# Patient Record
Sex: Female | Born: 1950 | Race: Black or African American | Hispanic: No | Smoking: Former smoker
Health system: Southern US, Community
[De-identification: ages and names within clinical notes are randomized; demographics above are authoritative.]

## PROBLEM LIST (undated history)

## (undated) DIAGNOSIS — Z973 Presence of spectacles and contact lenses: Secondary | ICD-10-CM

## (undated) DIAGNOSIS — E785 Hyperlipidemia, unspecified: Secondary | ICD-10-CM

## (undated) DIAGNOSIS — F329 Major depressive disorder, single episode, unspecified: Secondary | ICD-10-CM

## (undated) DIAGNOSIS — Z01419 Encounter for gynecological examination (general) (routine) without abnormal findings: Secondary | ICD-10-CM

## (undated) DIAGNOSIS — F32A Depression, unspecified: Secondary | ICD-10-CM

## (undated) DIAGNOSIS — Z87891 Personal history of nicotine dependence: Secondary | ICD-10-CM

## (undated) DIAGNOSIS — Z972 Presence of dental prosthetic device (complete) (partial): Secondary | ICD-10-CM

## (undated) DIAGNOSIS — M8580 Other specified disorders of bone density and structure, unspecified site: Secondary | ICD-10-CM

## (undated) DIAGNOSIS — I10 Essential (primary) hypertension: Secondary | ICD-10-CM

## (undated) HISTORY — DX: Other specified disorders of bone density and structure, unspecified site: M85.80

## (undated) HISTORY — DX: Presence of spectacles and contact lenses: Z97.3

## (undated) HISTORY — DX: Personal history of nicotine dependence: Z87.891

## (undated) HISTORY — DX: Major depressive disorder, single episode, unspecified: F32.9

## (undated) HISTORY — DX: Hyperlipidemia, unspecified: E78.5

## (undated) HISTORY — DX: Encounter for gynecological examination (general) (routine) without abnormal findings: Z01.419

## (undated) HISTORY — DX: Depression, unspecified: F32.A

## (undated) HISTORY — DX: Essential (primary) hypertension: I10

## (undated) HISTORY — DX: Presence of dental prosthetic device (complete) (partial): Z97.2

---

## 1997-07-01 ENCOUNTER — Other Ambulatory Visit: Admission: RE | Admit: 1997-07-01 | Discharge: 1997-07-01 | Payer: Self-pay | Admitting: Obstetrics

## 1997-07-02 ENCOUNTER — Other Ambulatory Visit: Admission: RE | Admit: 1997-07-02 | Discharge: 1997-07-02 | Payer: Self-pay | Admitting: Obstetrics

## 1997-07-03 ENCOUNTER — Ambulatory Visit: Admission: RE | Admit: 1997-07-03 | Discharge: 1997-07-03 | Payer: Self-pay | Admitting: Obstetrics

## 1998-07-07 ENCOUNTER — Other Ambulatory Visit: Admission: RE | Admit: 1998-07-07 | Discharge: 1998-07-07 | Payer: Self-pay | Admitting: Obstetrics

## 1998-07-09 ENCOUNTER — Ambulatory Visit (HOSPITAL_COMMUNITY): Admission: RE | Admit: 1998-07-09 | Discharge: 1998-07-09 | Payer: Self-pay | Admitting: *Deleted

## 1998-07-15 ENCOUNTER — Encounter: Payer: Self-pay | Admitting: Obstetrics

## 1998-07-15 ENCOUNTER — Ambulatory Visit (HOSPITAL_COMMUNITY): Admission: RE | Admit: 1998-07-15 | Discharge: 1998-07-15 | Payer: Self-pay | Admitting: Obstetrics

## 1998-11-17 ENCOUNTER — Other Ambulatory Visit: Admission: RE | Admit: 1998-11-17 | Discharge: 1998-11-17 | Payer: Self-pay | Admitting: Obstetrics

## 1999-01-25 ENCOUNTER — Ambulatory Visit (HOSPITAL_COMMUNITY): Admission: RE | Admit: 1999-01-25 | Discharge: 1999-01-25 | Payer: Self-pay | Admitting: Obstetrics

## 1999-01-25 ENCOUNTER — Encounter: Payer: Self-pay | Admitting: Obstetrics

## 1999-12-21 ENCOUNTER — Other Ambulatory Visit: Admission: RE | Admit: 1999-12-21 | Discharge: 1999-12-21 | Payer: Self-pay | Admitting: Obstetrics

## 2000-03-01 ENCOUNTER — Encounter: Payer: Self-pay | Admitting: Obstetrics

## 2000-03-01 ENCOUNTER — Ambulatory Visit (HOSPITAL_COMMUNITY): Admission: RE | Admit: 2000-03-01 | Discharge: 2000-03-01 | Payer: Self-pay | Admitting: Obstetrics

## 2001-04-16 ENCOUNTER — Encounter: Payer: Self-pay | Admitting: Obstetrics

## 2001-04-16 ENCOUNTER — Ambulatory Visit (HOSPITAL_COMMUNITY): Admission: RE | Admit: 2001-04-16 | Discharge: 2001-04-16 | Payer: Self-pay | Admitting: Obstetrics

## 2002-05-28 ENCOUNTER — Ambulatory Visit (HOSPITAL_COMMUNITY): Admission: RE | Admit: 2002-05-28 | Discharge: 2002-05-28 | Payer: Self-pay | Admitting: Obstetrics

## 2002-05-28 ENCOUNTER — Encounter: Payer: Self-pay | Admitting: Obstetrics

## 2003-02-16 LAB — HM COLONOSCOPY: HM Colonoscopy: NORMAL

## 2003-06-02 ENCOUNTER — Ambulatory Visit (HOSPITAL_COMMUNITY): Admission: RE | Admit: 2003-06-02 | Discharge: 2003-06-02 | Payer: Self-pay | Admitting: Obstetrics

## 2004-02-14 HISTORY — PX: COLONOSCOPY: SHX174

## 2004-06-07 ENCOUNTER — Ambulatory Visit (HOSPITAL_COMMUNITY): Admission: RE | Admit: 2004-06-07 | Discharge: 2004-06-07 | Payer: Self-pay | Admitting: Obstetrics

## 2005-06-14 ENCOUNTER — Ambulatory Visit (HOSPITAL_COMMUNITY): Admission: RE | Admit: 2005-06-14 | Discharge: 2005-06-14 | Payer: Self-pay | Admitting: Obstetrics

## 2006-07-11 ENCOUNTER — Ambulatory Visit (HOSPITAL_COMMUNITY): Admission: RE | Admit: 2006-07-11 | Discharge: 2006-07-11 | Payer: Self-pay | Admitting: Obstetrics

## 2007-07-16 ENCOUNTER — Ambulatory Visit (HOSPITAL_COMMUNITY): Admission: RE | Admit: 2007-07-16 | Discharge: 2007-07-16 | Payer: Self-pay | Admitting: Obstetrics

## 2007-07-18 ENCOUNTER — Ambulatory Visit: Payer: Self-pay | Admitting: Surgery

## 2008-01-16 ENCOUNTER — Ambulatory Visit (HOSPITAL_COMMUNITY): Admission: RE | Admit: 2008-01-16 | Discharge: 2008-01-16 | Payer: Self-pay | Admitting: Family Medicine

## 2008-07-29 ENCOUNTER — Ambulatory Visit (HOSPITAL_COMMUNITY): Admission: RE | Admit: 2008-07-29 | Discharge: 2008-07-29 | Payer: Self-pay | Admitting: Obstetrics

## 2009-03-23 ENCOUNTER — Ambulatory Visit: Payer: Self-pay | Admitting: Family Medicine

## 2009-06-22 ENCOUNTER — Ambulatory Visit: Payer: Self-pay | Admitting: Family Medicine

## 2009-07-13 ENCOUNTER — Ambulatory Visit: Payer: Self-pay | Admitting: Family Medicine

## 2009-08-03 ENCOUNTER — Ambulatory Visit (HOSPITAL_COMMUNITY): Admission: RE | Admit: 2009-08-03 | Discharge: 2009-08-03 | Payer: Self-pay | Admitting: Obstetrics

## 2009-10-13 ENCOUNTER — Ambulatory Visit: Payer: Self-pay | Admitting: Family Medicine

## 2010-02-16 ENCOUNTER — Ambulatory Visit
Admission: RE | Admit: 2010-02-16 | Discharge: 2010-02-16 | Payer: Self-pay | Source: Home / Self Care | Attending: Family Medicine | Admitting: Family Medicine

## 2010-05-18 ENCOUNTER — Ambulatory Visit (INDEPENDENT_AMBULATORY_CARE_PROVIDER_SITE_OTHER): Payer: 59 | Admitting: Family Medicine

## 2010-05-18 DIAGNOSIS — F17200 Nicotine dependence, unspecified, uncomplicated: Secondary | ICD-10-CM

## 2010-05-18 DIAGNOSIS — I10 Essential (primary) hypertension: Secondary | ICD-10-CM

## 2010-05-18 DIAGNOSIS — E119 Type 2 diabetes mellitus without complications: Secondary | ICD-10-CM

## 2010-05-18 DIAGNOSIS — F329 Major depressive disorder, single episode, unspecified: Secondary | ICD-10-CM

## 2010-05-18 DIAGNOSIS — E7800 Pure hypercholesterolemia, unspecified: Secondary | ICD-10-CM

## 2010-07-20 ENCOUNTER — Other Ambulatory Visit (HOSPITAL_COMMUNITY): Payer: Self-pay | Admitting: Obstetrics

## 2010-07-20 DIAGNOSIS — Z1231 Encounter for screening mammogram for malignant neoplasm of breast: Secondary | ICD-10-CM

## 2010-08-01 ENCOUNTER — Encounter: Payer: Self-pay | Admitting: Family Medicine

## 2010-08-01 DIAGNOSIS — F329 Major depressive disorder, single episode, unspecified: Secondary | ICD-10-CM | POA: Insufficient documentation

## 2010-08-01 DIAGNOSIS — E789 Disorder of lipoprotein metabolism, unspecified: Secondary | ICD-10-CM | POA: Insufficient documentation

## 2010-08-01 DIAGNOSIS — I10 Essential (primary) hypertension: Secondary | ICD-10-CM | POA: Insufficient documentation

## 2010-08-01 DIAGNOSIS — E7800 Pure hypercholesterolemia, unspecified: Secondary | ICD-10-CM | POA: Insufficient documentation

## 2010-08-01 DIAGNOSIS — E119 Type 2 diabetes mellitus without complications: Secondary | ICD-10-CM | POA: Insufficient documentation

## 2010-08-01 DIAGNOSIS — M8580 Other specified disorders of bone density and structure, unspecified site: Secondary | ICD-10-CM | POA: Insufficient documentation

## 2010-08-01 DIAGNOSIS — F17200 Nicotine dependence, unspecified, uncomplicated: Secondary | ICD-10-CM | POA: Insufficient documentation

## 2010-08-01 DIAGNOSIS — E559 Vitamin D deficiency, unspecified: Secondary | ICD-10-CM | POA: Insufficient documentation

## 2010-08-08 ENCOUNTER — Ambulatory Visit (HOSPITAL_COMMUNITY)
Admission: RE | Admit: 2010-08-08 | Discharge: 2010-08-08 | Disposition: A | Discharge status: Home / Self Care | Payer: 59 | Source: Ambulatory Visit | Attending: Obstetrics | Admitting: Obstetrics

## 2010-08-08 DIAGNOSIS — Z1231 Encounter for screening mammogram for malignant neoplasm of breast: Secondary | ICD-10-CM | POA: Insufficient documentation

## 2010-08-19 ENCOUNTER — Encounter: Payer: Self-pay | Admitting: Family Medicine

## 2010-08-22 ENCOUNTER — Ambulatory Visit: Payer: 59 | Admitting: Family Medicine

## 2010-08-22 ENCOUNTER — Encounter: Payer: Self-pay | Admitting: Medical

## 2010-08-22 ENCOUNTER — Ambulatory Visit (INDEPENDENT_AMBULATORY_CARE_PROVIDER_SITE_OTHER): Payer: BC Managed Care – PPO | Admitting: Medical

## 2010-08-22 VITALS — BP 130/90 | HR 80 | Temp 98.2°F | Ht 66.5 in | Wt 182.0 lb

## 2010-08-22 DIAGNOSIS — E559 Vitamin D deficiency, unspecified: Secondary | ICD-10-CM

## 2010-08-22 DIAGNOSIS — E119 Type 2 diabetes mellitus without complications: Secondary | ICD-10-CM

## 2010-08-22 DIAGNOSIS — F32A Depression, unspecified: Secondary | ICD-10-CM

## 2010-08-22 DIAGNOSIS — E785 Hyperlipidemia, unspecified: Secondary | ICD-10-CM

## 2010-08-22 DIAGNOSIS — I10 Essential (primary) hypertension: Secondary | ICD-10-CM

## 2010-08-22 DIAGNOSIS — F3289 Other specified depressive episodes: Secondary | ICD-10-CM

## 2010-08-22 DIAGNOSIS — F329 Major depressive disorder, single episode, unspecified: Secondary | ICD-10-CM

## 2010-08-22 MED ORDER — LOSARTAN POTASSIUM-HCTZ 100-12.5 MG PO TABS: 1.0000 | ORAL_TABLET | Freq: Every day | ORAL | Status: DC

## 2010-08-22 MED ORDER — BUPROPION HCL ER (XL) 150 MG PO TB24: 150.0000 mg | ORAL_TABLET | Freq: Every day | ORAL | Status: DC

## 2010-08-22 MED ORDER — DILTIAZEM HCL ER COATED BEADS 180 MG PO CP24: 180.0000 mg | ORAL_CAPSULE | Freq: Every day | ORAL | Status: DC

## 2010-08-22 NOTE — Progress Notes (Signed)
Addended by: Dorthula Perfect on: 08/22/2010 09:21 AM   Modules accepted: Orders

## 2010-08-22 NOTE — Progress Notes (Signed)
Subjective:    Brenda [name] is an 60 y.o. female who presents for follow up of chronic disease management.   Last visit 05/18/10 with Dr. Lynelle Doctor.  At that time Wellbutrin was added for depressed mood, fish oil added, and Pravastatin was increased to 80mg .  However she is taking the 40mg  instead.    Current symptoms include: none. Patient denies foot ulcerations, hyperglycemia, hypoglycemia , nausea, polydipsia and polyuria.  Last dilated eye exam within last 11mo.     Patient here for follow-up of elevated blood pressure.  She is exercising and is adherent to a low-salt diet.  Blood pressure is well controlled at home. Cardiac symptoms: none. Patient denies: chest pain, dyspnea, fatigue and irregular heart beat. Cardiovascular risk factors: diabetes mellitus, dyslipidemia and hypertension. Use of agents associated with hypertension: none. History of target organ damage: none.   Brenda [name] is here for follow up of elevated cholesterol. Compliance with treatment has been good. The patient exercises daily. Patient denies muscle pain associated with her medications.  The following portions of the patient's history were reviewed and updated as appropriate: allergies, current medications, past family history, past medical history, past social history, past surgical history and problem list.  Past Medical History  Diagnosis Date  . Hypertension   . Depression   . Vitamin D deficiency   . Osteoporosis     OSTEOPENIA  . Diabetes mellitus   . Smoker   . Hyperlipidemia     Review of Systems Constitutional: denies recent fevers, chills, weight changes, anorexia Allergy: denies recent allergy problems, nasal congestion, sneezing Skin: +hair seems brittle;  denies foot lesions, rash ENT: denies cough, runny nose, sore throat, hoarseness Cardiology: denies chest pain, palpitations, edema, orthopnea, paroxysmal nocturnal dyspnea Respiratory: denies shortness of breath, dyspnea on exertion,  wheezing Gastroenterology: no abdominal pain, nausea, vomiting, diarrhea, constipation, bowel change, blood in stool Musculoskeletal: denies arthralgias, myalgias, cramps Opthalmology: denies vision change, blurred vision, allergy symptoms Urology: denies dysuria, frequency, urgency, blood in urine, stream changes Neurology: denies numbness, tingling, weakness, gait changes     Objective:   Filed Vitals:   08/22/10 0835  BP: 130/90  Pulse: 80  Temp: 98.2 F (36.8 C)    General Appearance:    Alert, no distress, black female, WD/WN   HEENT:    Normocephalic, without obvious abnormality, PERRLA, conjunctiva/corneas clear, EOM intact, fundi benign, TMs and external ears normal, nares patent, mucosa normal, pharynx normal appearing  Oral cavity:   Lips, mucosa, and tongue normal; teeth and gums normal  Neck:   Supple, no lymphadenopathy;  thyroid:  no    enlargement/tenderness/nodules; no carotid   bruit or JVD  Lungs:     Clear to auscultation bilaterally without wheezes, rales or       rhonchi; respirations unlabored   Heart:    Regular rate and rhythm, S1 and S2 normal, no murmur  Extremities:   No clubbing, cyanosis or edema  Pulses:   2+ and symmetric all extremities  Skin:   Warm, dry, normal turgor, no foot lesions          Psych:   Normal mood, affect, hygiene and grooming.     Assessment:   Encounter Diagnoses  Name Primary?  . Type II or unspecified type diabetes mellitus without mention of complication, not stated as uncontrolled Yes  . Hyperlipidemia   . Vitamin D deficiency   . Essential hypertension, benign   . Depression       Plan:  DM type II - HgbA1C jumped up to 6.7%, up from 6.4% in April.  She declines Metformin for now, and she notes much more bread intake in the last several weeks.  She will work harder on diet, increase walking, and recheck in 3-4 mo.  Will consider Metformin if HgbA1C still over 6.4% at that time.  She is UTD on eye exam, checks feet  daily.    Hyperlipidemia- labs today.  She has been taking Pravastatin 40mg  instead of 80mg . This was suppose to be 80mg  since last check in April.  If lipids not at goal, consider Pravastatin 80mg  or change to other medication.  Vit D deficiency- c/t Vit D  HTN - per home readings, under pretty good control.  C/t same meds, low salt diet, regular exercise.  Depression - improved on current regimen.  C/t same meds.

## 2010-08-23 ENCOUNTER — Telehealth: Payer: Self-pay | Admitting: *Deleted

## 2010-08-23 DIAGNOSIS — E7800 Pure hypercholesterolemia, unspecified: Secondary | ICD-10-CM

## 2010-08-23 LAB — COMPREHENSIVE METABOLIC PANEL
AST: 18 U/L (ref 0–37)
Alkaline Phosphatase: 82 U/L (ref 39–117)
BUN: 16 mg/dL (ref 6–23)
CO2: 22 mEq/L (ref 19–32)
Calcium: 9.9 mg/dL (ref 8.4–10.5)
Creat: 0.85 mg/dL (ref 0.50–1.10)

## 2010-08-23 LAB — LIPID PANEL
Cholesterol: 204 mg/dL — ABNORMAL HIGH (ref 0–200)
HDL: 40 mg/dL (ref >39)
Total CHOL/HDL Ratio: 5.1 Ratio
Triglycerides: 119 mg/dL (ref <150)
VLDL: 24 mg/dL (ref 0–40)

## 2010-08-23 MED ORDER — ATORVASTATIN CALCIUM 40 MG PO TABS: 40.0000 mg | ORAL_TABLET | Freq: Every day | ORAL | Status: DC

## 2010-08-23 NOTE — Telephone Encounter (Addendum)
Message copied by Dorthula Perfect on Tue Aug 23, 2010  2:02 PM ------      Message from: Melody Hill, Kermit Balo      Created: Tue Aug 23, 2010  7:44 AM       Her cholesterol is not at goal, her bad cholesterol is 140. It should be 80 or less.  I want to switch her to Lipitor. Thus, stop pravastatin, and call out Lipitor 40 milligrams, 1 tablet daily, #90, refill x3. Place in electronic order as well.  Once she gets her Lipitor supply, stop pravastatin.  Her liver, kidney, electrolytes were normal.  Recheck in 3 months.   Pt notified of lab results.  Pt agreed to switch to lipitor 40 mg qd.  Pt understood to stop pravastatin once she started taking Lipitor. Pt scheduled for 3 month follow up in November.  Sent prescription to [facility] mail order for Lipitor 40 mg 1 po QD #90 with 3 refills.  CM, LPN

## 2010-08-23 NOTE — Progress Notes (Signed)
Left message for pt to return call regarding lab results.  CM, LPN 

## 2010-09-05 ENCOUNTER — Other Ambulatory Visit: Payer: Self-pay | Admitting: Medical

## 2010-09-05 DIAGNOSIS — E7800 Pure hypercholesterolemia, unspecified: Secondary | ICD-10-CM

## 2010-09-05 MED ORDER — ATORVASTATIN CALCIUM 40 MG PO TABS: 40.0000 mg | ORAL_TABLET | Freq: Every day | ORAL | Status: DC

## 2010-09-05 MED ORDER — DILTIAZEM HCL ER COATED BEADS 180 MG PO CP24: 180.0000 mg | ORAL_CAPSULE | Freq: Every day | ORAL | Status: DC

## 2010-09-05 MED ORDER — BUPROPION HCL ER (XL) 150 MG PO TB24: 150.0000 mg | ORAL_TABLET | Freq: Every day | ORAL | Status: DC

## 2010-09-05 MED ORDER — LOSARTAN POTASSIUM-HCTZ 100-12.5 MG PO TABS: 1.0000 | ORAL_TABLET | Freq: Every day | ORAL | Status: DC

## 2010-12-23 ENCOUNTER — Encounter: Payer: Self-pay | Admitting: Medical

## 2010-12-23 ENCOUNTER — Ambulatory Visit (INDEPENDENT_AMBULATORY_CARE_PROVIDER_SITE_OTHER): Payer: BC Managed Care – PPO | Admitting: Medical

## 2010-12-23 VITALS — BP 170/90 | HR 72 | Temp 98.1°F | Resp 16 | Wt 186.0 lb

## 2010-12-23 DIAGNOSIS — I10 Essential (primary) hypertension: Secondary | ICD-10-CM

## 2010-12-23 DIAGNOSIS — E119 Type 2 diabetes mellitus without complications: Secondary | ICD-10-CM | POA: Insufficient documentation

## 2010-12-23 DIAGNOSIS — E785 Hyperlipidemia, unspecified: Secondary | ICD-10-CM | POA: Insufficient documentation

## 2010-12-23 DIAGNOSIS — Z23 Encounter for immunization: Secondary | ICD-10-CM

## 2010-12-23 DIAGNOSIS — F17200 Nicotine dependence, unspecified, uncomplicated: Secondary | ICD-10-CM | POA: Insufficient documentation

## 2010-12-23 DIAGNOSIS — F329 Major depressive disorder, single episode, unspecified: Secondary | ICD-10-CM

## 2010-12-23 LAB — CBC
HCT: 37.8 % (ref 36.0–46.0)
Hemoglobin: 12.4 g/dL (ref 12.0–15.0)
MCH: 30.1 pg (ref 26.0–34.0)
MCV: 91.7 fL (ref 78.0–100.0)
RBC: 4.12 MIL/uL (ref 3.87–5.11)
RDW: 15 % (ref 11.5–15.5)

## 2010-12-23 LAB — LIPID PANEL: Total CHOL/HDL Ratio: 3.7 Ratio

## 2010-12-23 LAB — ALT: ALT: 17 U/L (ref 0–35)

## 2010-12-23 MED ORDER — PNEUMOCOCCAL VAC POLYVALENT 25 MCG/0.5ML IJ INJ: 0.5000 mL | INJECTION | INTRAMUSCULAR | Status: AC

## 2010-12-23 MED ORDER — LOSARTAN POTASSIUM-HCTZ 100-25 MG PO TABS: 1.0000 | ORAL_TABLET | Freq: Every day | ORAL | Status: DC

## 2010-12-23 NOTE — Progress Notes (Signed)
Subjective:   HPI  Brenda [name] is a 60 y.o. female who presents for routine f/u home blood pressure, diabetes, cholesterol, depression.  Since last visit she switched to Lipitor since her cholesterol was not at goal, she has cut out all [employer] bread now and is eating healthier, she is exercising routinely with walking, but she says her blood pressure is higher of late.  She notes that her BP has been as high as 143/92 at home, but usually in the 130/80s.  Her mood is up and down, and she notes that she has had depressed mood since being widowed in her 36s, some days are better than others.  She works at [employer] as a Engineer, civil (consulting)  and lately things have been stressful there, understaffed.  No other c/o.  Of note she sees Dr. Francoise Ceo for gynecology, last exam was in April and mammogram in June of this year.  The following portions of the patient's history were reviewed and updated as appropriate: allergies, current medications, past family history, past medical history, past social history, past surgical history and problem list.  Allergies  Allergen Reactions  . Nuprin (Ibuprofen) Hives    Current Outpatient Prescriptions on File Prior to Visit  Medication Sig Dispense Refill  . aspirin 325 MG EC tablet Take 325 mg by mouth daily.        Marland Kitchen atorvastatin (LIPITOR) 40 MG tablet Take 1 tablet (40 mg total) by mouth daily.  90 tablet  3  . buPROPion (WELLBUTRIN XL) 150 MG 24 hr tablet Take 1 tablet (150 mg total) by mouth daily.  90 tablet  3  . diltiazem (CARDIZEM CD) 180 MG 24 hr capsule Take 1 capsule (180 mg total) by mouth daily.  90 capsule  3  . ergocalciferol (VITAMIN D2) 50000 UNITS capsule Take 50,000 Units by mouth once a week.        . fish oil-omega-3 fatty acids 1000 MG capsule Take 1 g by mouth daily.        . sertraline (ZOLOFT) 100 MG tablet Take 100 mg by mouth daily.         No current facility-administered medications on file prior to visit.    Past Medical  History  Diagnosis Date  . Hypertension   . Depression   . Vitamin D deficiency   . Osteoporosis     OSTEOPENIA  . Diabetes mellitus   . Smoker   . Hyperlipidemia     Past Surgical History  Procedure Date  . Cesarean section 04/12/1983    Family History  Problem Relation Age of Onset  . Heart disease Father     History   Social History  . Marital Status: Widowed    Spouse Name: N/A    Number of Children: N/A  . Years of Education: N/A   Occupational History  . Not on file.   Social History Main Topics  . Smoking status: Former Games developer  . Smokeless tobacco: Never Used  . Alcohol Use: No  . Drug Use: No  . Sexually Active: Not on file   Other Topics Concern  . 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, exercises with walking   Review of Systems Constitutional: -fever, -chills, -sweats, -unexpected -weight change,-fatigue ENT: -runny nose, -ear pain, -sore throat Cardiology:  -chest pain, -palpitations, -edema Respiratory: -cough, -shortness of breath, -wheezing Gastroenterology: -abdominal pain, -nausea, -vomiting, -diarrhea, -  constipation Hematology: -bleeding or bruising problems Musculoskeletal: -arthralgias, -myalgias, -joint swelling, -back pain Ophthalmology: -vision changes Urology: -dysuria, -difficulty urinating, -hematuria, -urinary frequency, -urgency Neurology: -headache, -weakness, -tingling, -numbness    Objective:   Physical Exam  Filed Vitals:   12/23/10 0811  BP: 170/90  Pulse: 72  Temp: 98.1 F (36.7 C)  Resp: 16    General appearance: alert, no distress, WD/WN, black female Oral cavity: MMM, no lesions Neck: supple, no lymphadenopathy, no thyromegaly, no masses, no bruits Heart: RRR, normal S1, S2, no murmurs Lungs: CTA bilaterally, no wheezes, rhonchi, or rales Abdomen: +bs, soft, non tender, non distended, no masses, no hepatomegaly, no splenomegaly Pulses:  2+ symmetric, upper and lower extremities, normal cap refill Skin no foot lesions Neuro: Normal foot sensation to monofilament   Assessment and Plan :    Encounter Diagnoses  Name Primary?  . Type II or unspecified type diabetes mellitus without mention of complication, not stated as uncontrolled Yes  . Essential hypertension, benign   . Hyperlipidemia   . Tobacco use disorder   . Depression   . Need for pneumococcal vaccination    Diabetes type 2- hemoglobin A1c dropped from 6.7% last time to 5.9% today.  I congratulated her on her improvements in diet.  Her eye exam is up-to-date within this year, she is checking her feet daily, checking her blood sugars regularly, and I recommend she continue doing what she's doing.  Hypertension-increased her losartan HCT 100/25 mg today. Continue diltiazem unchanged.  Hyperlipidemia-labs today and hopefully we will see improvements since last time since we switched to Lipitor 40 mg a day  Tobacco use disorder-advise she try and quit completely including weaning off the electronic cigarette  Depression-relatively controlled on current medication  Updated her pneumococcal vaccine today, and she had her flu shot last month  She will sign records release to get a copy of her last EKG through Dr. Ocie Bob 3 years ago, and copy of bone density scan from 01/16/08  Follow-up pending labs, or in 3-4 months

## 2011-02-21 ENCOUNTER — Telehealth: Payer: Self-pay | Admitting: Medical

## 2011-02-21 ENCOUNTER — Other Ambulatory Visit: Payer: Self-pay | Admitting: Medical

## 2011-02-21 MED ORDER — SERTRALINE HCL 100 MG PO TABS: 100.0000 mg | ORAL_TABLET | Freq: Every day | ORAL | Status: DC

## 2011-02-23 NOTE — Telephone Encounter (Signed)
TSD  

## 2011-04-04 ENCOUNTER — Ambulatory Visit: Payer: BC Managed Care – PPO | Admitting: Medical

## 2011-04-12 ENCOUNTER — Encounter: Payer: Self-pay | Admitting: Internal Medicine

## 2011-04-18 ENCOUNTER — Ambulatory Visit (INDEPENDENT_AMBULATORY_CARE_PROVIDER_SITE_OTHER): Payer: BC Managed Care – PPO | Admitting: Medical

## 2011-04-18 ENCOUNTER — Encounter: Payer: Self-pay | Admitting: Medical

## 2011-04-18 VITALS — BP 140/70 | HR 68 | Temp 98.2°F | Resp 16 | Wt 186.0 lb

## 2011-04-18 DIAGNOSIS — I10 Essential (primary) hypertension: Secondary | ICD-10-CM

## 2011-04-18 DIAGNOSIS — E785 Hyperlipidemia, unspecified: Secondary | ICD-10-CM

## 2011-04-18 DIAGNOSIS — E119 Type 2 diabetes mellitus without complications: Secondary | ICD-10-CM

## 2011-04-18 LAB — COMPREHENSIVE METABOLIC PANEL
Albumin: 4.4 g/dL (ref 3.5–5.2)
Alkaline Phosphatase: 81 U/L (ref 39–117)
BUN: 16 mg/dL (ref 6–23)
Chloride: 102 mEq/L (ref 96–112)
Creat: 0.93 mg/dL (ref 0.50–1.10)
Total Protein: 7.1 g/dL (ref 6.0–8.3)

## 2011-04-18 LAB — LIPID PANEL
Cholesterol: 179 mg/dL (ref 0–200)
Total CHOL/HDL Ratio: 4.4 Ratio
Triglycerides: 145 mg/dL (ref <150)

## 2011-04-18 NOTE — Patient Instructions (Signed)
Kristian Covey, PA-C Phone: [phone] Fax: [phone] April 18, 2011   Brenda [name] [address] [place] [address]    Dear Brenda [name]:  To help you develop diabetes management strategies, we would like to remind you of the following important guidelines:  1) You should have blood work done at least twice yearly to monitor your Hemoglobin A1C (a three-month average of your blood sugars) and your cholesterol.   2) You should have your urine checked yearly to watch for kidney damage. Diabetes can cause kidney failure, which could require frequent dialysis.  3) You should see an eye doctor and have a foot exam yearly to help prevent diabetic blood vessel complications in your eyes and feet.  4) You should receive a yearly flu shot (between October and March). We also recommend that you receive a pneumonia shot once every ten years, or at least after age 29 - you are up to date on this.  5) Make sure your blood pressure is controlled (less than 130/80). Monitoring your blood pressure with a home blood pressure cuff of your own is an excellent idea for diabetics.  6) If you smoke, quitting will reduce your risk of heart attack and stroke.  7) Exercise regularly since it has beneficial effects on the heart and blood sugars. Exercising three times per week can be as important as medication to a diabetic.   We have included a personalized diabetic report card on the following page to help you assess what you're currently doing well and which areas need improvement.  Thank you for including [place] as members of your health care team.  Sincerely,  Kristian Covey, PA-C  Enclosure    Diabetic Report Card for Brenda [name]  April 18, 2011   Below is a summary of recent tests related to your diabetes that can help you manage your health.   Hemoglobin A1C:  Your Hemoglobin A1C values should be less than 7. If these are greater than 7, you have a higher chance of having eye,  heart, and kidney problems in the future.   Your most recent Hemoglobin A1C values were:  Hemoglobin A1C (no units)  Date Value  12/23/2010 5.7   08/22/2010 6.7     Cholesterol:  Your LDL Cholesterol (bad cholesterol) values should be less than 80. If they are consistently higher than 80,  then your risk of heart attack and stroke increases yearly.   Your most recent LDL Cholesterol (bad cholesterol) results were:  LDL Cholesterol (mg/dL)  Date Value  13/0/8657 96   08/22/2010 140*    Blood Pressure:  Your blood pressure values should be less than 130/80. Please contact me if your readings at home are consistently higher than this.   Your most recent blood pressure readings at our clinic were:  BP Readings from Last 3 Encounters:  04/18/11 140/70  12/23/10 170/90  08/22/10 130/90

## 2011-04-18 NOTE — Progress Notes (Signed)
Subjective:   HPI  Brenda [name] is a 61 y.o. female who presents for recheck on chronic issues.  She is checking glucose 3-4 times per week at random times, ranging 70-130.  She is checking BP regularly, QHS, getting 120/70s routinely at home.  She is walking for exercise, eating healthy, still maintaining a better diet than before 12/2010.  She does drink a lot of coffee and sweet tea.  But avoids lots of bread.  She does eat oatmeal and takes a fiber supplement.    She sees gynecology.  She had colonoscopy last about 8 years ago, but declines to repeat this.  She is agreeable to FOBT test yearly.  Last FOB about 7 mo ago.  No new problems.    The following portions of the patient's history were reviewed and updated as appropriate: allergies, current medications, past family history, past medical history, past social history, past surgical history and problem list.  Past Medical History  Diagnosis Date  . Hypertension   . Depression   . Vitamin d deficiency   . Osteoporosis     OSTEOPENIA  . Diabetes mellitus   . Smoker   . Hyperlipidemia     Allergies  Allergen Reactions  . Nuprin (Ibuprofen) Hives    Can tolerate generic Ibuprofen     Review of Systems ROS reviewed and was negative other than noted in HPI or above.    Objective:   Physical Exam  General appearance: alert, no distress, WD/WN Neck: supple, no lymphadenopathy, no thyromegaly, no masses Heart: RRR, normal S1, S2, no murmurs Lungs: CTA bilaterally, no wheezes, rhonchi, or rales Abdomen: +bs, soft, non tender, non distended, no masses, no hepatomegaly, no splenomegaly Pulses: 2+ symmetric, upper and lower extremities, normal cap refill   Assessment and Plan :     Encounter Diagnoses  Name Primary?  . Type II or unspecified type diabetes mellitus without mention of complication, not stated as uncontrolled Yes  . Hyperlipidemia   . Essential hypertension, benign     Labs today, compliant with  medications, diet and exercise.  She agrees to FOB testing and Tdap booster next visit.  If labs at goal, then recheck 6+mo.  Reviewed July and November labs/OV notes.

## 2011-04-20 ENCOUNTER — Other Ambulatory Visit: Payer: Self-pay | Admitting: Medical

## 2011-04-20 MED ORDER — ATORVASTATIN CALCIUM 80 MG PO TABS: 80.0000 mg | ORAL_TABLET | Freq: Every day | ORAL | Status: DC

## 2011-07-11 ENCOUNTER — Other Ambulatory Visit: Payer: Self-pay | Admitting: Medical

## 2011-07-12 NOTE — Telephone Encounter (Signed)
RX REFILL ON ZOLOFT. CSLS

## 2011-07-31 ENCOUNTER — Other Ambulatory Visit (HOSPITAL_COMMUNITY): Payer: Self-pay | Admitting: Obstetrics

## 2011-07-31 DIAGNOSIS — Z1231 Encounter for screening mammogram for malignant neoplasm of breast: Secondary | ICD-10-CM

## 2011-08-23 ENCOUNTER — Ambulatory Visit (HOSPITAL_COMMUNITY)
Admission: RE | Admit: 2011-08-23 | Discharge: 2011-08-23 | Disposition: A | Discharge status: Home / Self Care | Payer: 59 | Source: Ambulatory Visit | Attending: Obstetrics | Admitting: Obstetrics

## 2011-08-23 DIAGNOSIS — Z1231 Encounter for screening mammogram for malignant neoplasm of breast: Secondary | ICD-10-CM | POA: Insufficient documentation

## 2011-08-28 ENCOUNTER — Other Ambulatory Visit: Payer: Self-pay | Admitting: Obstetrics

## 2011-08-28 DIAGNOSIS — R928 Other abnormal and inconclusive findings on diagnostic imaging of breast: Secondary | ICD-10-CM

## 2011-08-30 ENCOUNTER — Ambulatory Visit
Admission: RE | Admit: 2011-08-30 | Discharge: 2011-08-30 | Disposition: A | Discharge status: Home / Self Care | Payer: 59 | Source: Ambulatory Visit | Attending: Obstetrics | Admitting: Obstetrics

## 2011-08-30 DIAGNOSIS — R928 Other abnormal and inconclusive findings on diagnostic imaging of breast: Secondary | ICD-10-CM

## 2011-09-26 ENCOUNTER — Other Ambulatory Visit: Payer: Self-pay | Admitting: Medical

## 2011-09-30 ENCOUNTER — Other Ambulatory Visit: Payer: Self-pay | Admitting: Medical

## 2011-10-02 ENCOUNTER — Ambulatory Visit (INDEPENDENT_AMBULATORY_CARE_PROVIDER_SITE_OTHER): Payer: 59 | Admitting: Medical

## 2011-10-02 ENCOUNTER — Encounter: Payer: Self-pay | Admitting: Medical

## 2011-10-02 VITALS — BP 140/80 | HR 88 | Temp 98.3°F | Resp 16 | Wt 182.0 lb

## 2011-10-02 DIAGNOSIS — E109 Type 1 diabetes mellitus without complications: Secondary | ICD-10-CM | POA: Insufficient documentation

## 2011-10-02 DIAGNOSIS — Z23 Encounter for immunization: Secondary | ICD-10-CM

## 2011-10-02 DIAGNOSIS — E119 Type 2 diabetes mellitus without complications: Secondary | ICD-10-CM

## 2011-10-02 DIAGNOSIS — E785 Hyperlipidemia, unspecified: Secondary | ICD-10-CM

## 2011-10-02 DIAGNOSIS — Z1211 Encounter for screening for malignant neoplasm of colon: Secondary | ICD-10-CM

## 2011-10-02 DIAGNOSIS — I10 Essential (primary) hypertension: Secondary | ICD-10-CM

## 2011-10-02 DIAGNOSIS — F329 Major depressive disorder, single episode, unspecified: Secondary | ICD-10-CM

## 2011-10-02 LAB — LIPID PANEL
Cholesterol: 141 mg/dL (ref 0–200)
HDL: 41 mg/dL (ref >39)
LDL Cholesterol: 82 mg/dL (ref 0–99)
Triglycerides: 92 mg/dL (ref <150)

## 2011-10-02 MED ORDER — BUPROPION HCL ER (XL) 150 MG PO TB24: 150.0000 mg | ORAL_TABLET | Freq: Every day | ORAL | Status: DC

## 2011-10-02 NOTE — Progress Notes (Signed)
  Subjective:   HPI  Brenda [name] is a 61 y.o. female who presents for recheck.  Last visit here March for same, chronic disease f/u.  Here for recheck on diabetes, HTN, lipids, and depression.  Been doing well, exercising, eating healthy, but does eat out on occasion.  She notes home BP readings 120-130s/70-80s.  Home glucose readings fasting am <130.   She walks daily.   She saw eye doctor in May, and had mammogram in July. Mood been fine, "stable," wihtout c/o. No other c/o.  The following portions of the patient's history were reviewed and updated as appropriate: allergies, current medications, past family history, past medical history, past social history, past surgical history and problem list.  Past Medical History  Diagnosis Date  . Hypertension   . Depression   . Vitamin d deficiency   . Osteoporosis     OSTEOPENIA  . Diabetes mellitus   . Smoker   . Hyperlipidemia     Allergies  Allergen Reactions  . Nuprin (Ibuprofen) Hives    Can tolerate generic Ibuprofen     Review of Systems ROS reviewed and was negative other than noted in HPI or above.    Objective:   Physical Exam  General appearance: alert, no distress, WD/WN Heart: RRR, normal S1, S2, no murmurs Lungs: CTA bilaterally, no wheezes, rhonchi, or rales Abdomen: +bs, soft, non tender, non distended, no masses, no hepatomegaly, no splenomegaly Pulses: 2+ symmetric, upper and lower extremities, normal cap refill Feet: no foot lesions, pulses normal   Assessment and Plan :     Encounter Diagnoses  Name Primary?  . Type I (juvenile type) diabetes mellitus without mention of complication, not stated as uncontrolled Yes  . Essential hypertension, benign   . Hyperlipidemia   . Need for Tdap vaccination   . Screen for colon cancer   . Depression    DM type II - diagnosed 4 years ago, no prior medication, diet controlled, checks feet, last eye exam 06/2011, eating healthy, exercising.  HgbA1C  today.  HTN - based on home readings, controlled.  C/t current medication - Diltiazem and Hyzaar.   Hyperlipidemia - last visit we increased to Lipitor 80mg .  Lipid and ALT lab today.  Tdap vaccine given allong with VIS and vaccine counseling.   Screen for colon cancer - declines future colonoscopy.  Last colonoscopy 6 years ago, but can't recall doctor's name.   She is agreeable to home FOB cards today.    Depression - c/t current medications.  Refilled Bupropion for 90 days to [facility].   F/u pending labs.

## 2011-10-03 LAB — HEMOGLOBIN A1C: Hgb A1c MFr Bld: 6.5 % — ABNORMAL HIGH (ref <5.7)

## 2011-10-03 NOTE — Addendum Note (Signed)
Addended by: Janeice Robinson on: 10/03/2011 03:22 PM   Modules accepted: Orders

## 2011-10-16 ENCOUNTER — Other Ambulatory Visit: Payer: Self-pay | Admitting: Medical

## 2011-11-08 ENCOUNTER — Telehealth: Payer: Self-pay | Admitting: Family Medicine

## 2011-11-08 ENCOUNTER — Other Ambulatory Visit: Payer: Self-pay | Admitting: Medical

## 2011-11-08 NOTE — Telephone Encounter (Signed)
Patient called and said that her BP for the past week has been running high. She said systolic has been running 140 to 160 and her dystolic has been running from 92 to 98. She would like to know if can change the dose on the medication. CLS

## 2011-11-08 NOTE — Telephone Encounter (Signed)
Verify dose of hyzaar 100/12.5.   Looking back in chart, she had been on Hyzaar 100/25 daily.  I don't recall changing the dose, but somehow (could be an Epic issue regarding automatic computer software autopopulating data when medication was reordered or noted), but the previous visits she was on hyzaar 100/25.   Which dose is listed on her current medication bottle?  If she is on the 100/12.5, then I'll change back to 100/25 daily.

## 2011-11-10 NOTE — Telephone Encounter (Signed)
I spoke with the patient and she states that she is not going to do anything right now with the BP medication because she had just remember she was taking some OTC sinus medication and that was the reason for the elevated BP. She said she stop taking the medication and her BP went back down to 122/ 82. She said she will keep a close watch on it and if it changes she will call back. CLS

## 2012-01-16 ENCOUNTER — Other Ambulatory Visit: Payer: Self-pay | Admitting: Medical

## 2012-01-24 ENCOUNTER — Telehealth: Payer: Self-pay | Admitting: Family Medicine

## 2012-01-24 NOTE — Telephone Encounter (Signed)
PATIENT CALLED SAID SHE LEFT A COPY OF HER BLOOD PRESSURE READINGS AND THEY ARE RUNNING 140/90. SHE WANTED TO KNOW WHAT SHOULD SHE DO ABOUT BP MEDICATION. CLS

## 2012-01-25 ENCOUNTER — Other Ambulatory Visit: Payer: Self-pay | Admitting: Medical

## 2012-01-25 ENCOUNTER — Telehealth: Payer: Self-pay | Admitting: Medical

## 2012-01-25 MED ORDER — LOSARTAN POTASSIUM-HCTZ 100-25 MG PO TABS: 1.0000 | ORAL_TABLET | Freq: Every day | ORAL | Status: DC

## 2012-01-25 NOTE — Telephone Encounter (Signed)
i reviewed the BPs.   I have increased her Losartan HCT.  Begin the higher dose, recheck 13mo

## 2012-01-25 NOTE — Telephone Encounter (Signed)
LM

## 2012-01-25 NOTE — Telephone Encounter (Signed)
LEFT MESSAGE FOR PT TO CALL.

## 2012-02-26 ENCOUNTER — Encounter: Payer: Self-pay | Admitting: Medical

## 2012-02-26 ENCOUNTER — Telehealth: Payer: Self-pay | Admitting: Family Medicine

## 2012-02-26 ENCOUNTER — Ambulatory Visit (INDEPENDENT_AMBULATORY_CARE_PROVIDER_SITE_OTHER): Payer: 59 | Admitting: Medical

## 2012-02-26 VITALS — BP 142/90 | HR 82 | Temp 98.4°F | Resp 16 | Wt 188.0 lb

## 2012-02-26 DIAGNOSIS — I10 Essential (primary) hypertension: Secondary | ICD-10-CM

## 2012-02-26 DIAGNOSIS — F17200 Nicotine dependence, unspecified, uncomplicated: Secondary | ICD-10-CM

## 2012-02-26 DIAGNOSIS — E785 Hyperlipidemia, unspecified: Secondary | ICD-10-CM

## 2012-02-26 DIAGNOSIS — Z1211 Encounter for screening for malignant neoplasm of colon: Secondary | ICD-10-CM

## 2012-02-26 DIAGNOSIS — E119 Type 2 diabetes mellitus without complications: Secondary | ICD-10-CM

## 2012-02-26 DIAGNOSIS — F411 Generalized anxiety disorder: Secondary | ICD-10-CM

## 2012-02-26 DIAGNOSIS — F419 Anxiety disorder, unspecified: Secondary | ICD-10-CM

## 2012-02-26 LAB — HEMOCCULT GUIAC POC 1CARD (OFFICE)
Card #2 Fecal Occult Blod, POC: NEGATIVE
Card #3 Fecal Occult Blood, POC: NEGATIVE
Fecal Occult Blood, POC: NEGATIVE

## 2012-02-26 LAB — LIPID PANEL
Cholesterol: 152 mg/dL (ref 0–200)
HDL: 45 mg/dL (ref >39)
Total CHOL/HDL Ratio: 3.4 Ratio
Triglycerides: 86 mg/dL (ref <150)
VLDL: 17 mg/dL (ref 0–40)

## 2012-02-26 LAB — COMPREHENSIVE METABOLIC PANEL
AST: 17 U/L (ref 0–37)
Albumin: 4.3 g/dL (ref 3.5–5.2)
Alkaline Phosphatase: 87 U/L (ref 39–117)
BUN: 14 mg/dL (ref 6–23)
Creat: 0.91 mg/dL (ref 0.50–1.10)
Potassium: 3.9 mEq/L (ref 3.5–5.3)
Total Bilirubin: 0.3 mg/dL (ref 0.3–1.2)

## 2012-02-26 LAB — CBC
MCH: 29.4 pg (ref 26.0–34.0)
MCHC: 33.5 g/dL (ref 30.0–36.0)
Platelets: 289 10*3/uL (ref 150–400)

## 2012-02-26 MED ORDER — ALPRAZOLAM 0.25 MG PO TABS: ORAL_TABLET | ORAL | Status: DC

## 2012-02-26 MED ORDER — DILTIAZEM HCL ER COATED BEADS 240 MG PO CP24: 240.0000 mg | ORAL_CAPSULE | Freq: Every day | ORAL | Status: DC

## 2012-02-26 NOTE — Progress Notes (Signed)
Subjective: Here for recheck on hypertension.  Checks BP and it is not staying controlled on current medication.  She says when the DBP gets over 95 she gets headaches.   She is taking Losartan HCT 100/25 and Diltiazem ER 180mg  daily.  She has even recently taking 2 diltiazem at times.  In the past was on Lotensin.  Has been on Diltiazem for a long time.  She does not add salt to her food.  She is exercising with walking 3 times per week.  BP at home for the last 6 weeks with 140s mid to high and 90s SBP.  No chest pain, no DOE, no SOB.  Only gets palpitations if DBP gets in the high 90s.  She has never seen cardiologist or had a stress test.   She did see podiatry recently, had reportedly normal ABIs after no pulses were felt in the feet.  She hsa no claudication symptoms currently.  She uses 0 nicotine menthols for the smoke only.  She used to be a routine smoker.  She brought stool cards back today for analysis.  Glucose numbers are <130.  Eats healthy.  Takes her medication for cholesterol, compliant with medications for cholesterol, vit D.  She request medication to help with anxiety for prn use.  On Wellbutrin for depression, but occasional gets a little worked up, needs something to calm her down.  Has used low dose xanax prn use in the past.   Past Medical History  Diagnosis Date  . Hypertension   . Depression   . Vitamin D deficiency   . Osteoporosis     OSTEOPENIA  . Diabetes mellitus   . Smoker   . Hyperlipidemia    Review of Systems Constitutional:  -unexpected weight change,-fatigue Cardiology:  -chest pain, -palpitations, -edema Respiratory: -cough, -shortness of breath, -wheezing Gastroenterology: -abdominal pain, -nausea, -vomiting, -diarrhea, -constipation  Ophthalmology: -vision changes Urology: -dysuria, -difficulty urinating, -hematuria, -urinary frequency, -urgency Neurology:  -weakness, -tingling, -numbness  Objective: Gen: wd, wn, nad Skin: warm, dry Neck: supple, no  bruits, no mass or thyromegaly Heart: RRR, no murmur, normal s1, s2 Abdomen: +bs, soft, no obvious pulsatile mass or bruits UE normal pulses Pedal pulses almost absent bilat   Adult ECG Report  Indication: hypertension  Rate: 86bpm  Rhythm: normal sinus rhythm  QRS Axis: -1 degree  PR Interval:  QRS Duration: 84ms  QTc:  Conduction Disturbances: none  Other Abnormalities: none  Patient's cardiac risk factors are: diabetes mellitus, dyslipidemia, hypertension and smoking/ tobacco exposure.  EKG comparison: none  Narrative Interpretation: normal EKG   Assessment:  Encounter Diagnoses  Name Primary?  . Essential hypertension, benign Yes  . Tobacco use disorder   . Hyperlipidemia   . Type II or unspecified type diabetes mellitus without mention of complication, not stated as uncontrolled   . Screen for colon cancer   . Anxiety    Plan: Hypertension - reviewed EKG.   C/t Losartan HCT 100/25mg  daily.  Increase to Diltiazem CD to 240mg  daily.  Recheck 33mo.  Tobacco disorder - has significantly cut down.  Try and completely quit.  hyperlipidemia - c/t same medications, labs today.  Type II DM - diet controlled. Repeat labs today.    Screen for colon cancer - she brought back stool cards today which were all negative for occult blood.   Anxiety - refilled prn use of Xanax. Discussed other ways to deal with anxiety including exercise, deep breathing, listening to music.

## 2012-02-26 NOTE — Telephone Encounter (Signed)
Patient is aware of the medication change. CLS 

## 2012-02-26 NOTE — Telephone Encounter (Signed)
Message copied by Janeice Robinson on Mon Feb 26, 2012  4:48 PM ------      Message from: Aleen Campi, DAVID S      Created: Mon Feb 26, 2012  4:37 PM       C/t all medications the same except i increased diltiazem to 240mg  daily.  i sent this to pharmacy.  Lets make this change and f/u in 41mo.

## 2012-03-05 ENCOUNTER — Other Ambulatory Visit: Payer: Self-pay | Admitting: Medical

## 2012-03-05 MED ORDER — METFORMIN HCL 500 MG PO TABS: 500.0000 mg | ORAL_TABLET | Freq: Every day | ORAL | Status: DC

## 2012-04-03 ENCOUNTER — Encounter: Payer: Self-pay | Admitting: Medical

## 2012-04-03 ENCOUNTER — Ambulatory Visit (INDEPENDENT_AMBULATORY_CARE_PROVIDER_SITE_OTHER): Payer: 59 | Admitting: Medical

## 2012-04-03 VITALS — BP 138/90 | HR 84 | Temp 97.8°F | Resp 16 | Wt 188.0 lb

## 2012-04-03 MED ORDER — DILTIAZEM HCL ER COATED BEADS 300 MG PO CP24: 300.0000 mg | ORAL_CAPSULE | Freq: Every day | ORAL | Status: DC

## 2012-04-03 NOTE — Progress Notes (Signed)
Subjective: Here for f/u on blood pressure.   Last visit we continued Losartan HCT 100/25 but increased Diltiazem CD to 240mg  .  She does note improvements in BPs, less headaches, but still not at goal.  Still running mid 120s-140s SBP, 90s DBP.  She did not start metformin once daily.  She strongly feels like her increased HgbA1C wsa due to poor diet over the holidays and her cruise vacation.  She will be strict on her diet and states if HgbA1C over 7 in 61mo, then will go on metformin.  No new c/o.    Past Medical History  Diagnosis Date  . Depression   . Vitamin D deficiency   . Osteoporosis     OSTEOPENIA  . Diabetes mellitus   . Smoker   . Hyperlipidemia   . Hypertension    Review of Systems Constitutional: -fever, -chills, -sweats, -unexpected -weight change,-fatigue Cardiology:  -chest pain, -palpitations, -edema Respiratory: -cough, -shortness of breath, -wheezing Ophthalmology: -vision changes Urology: -dysuria, -difficulty urinating, -hematuria, -urinary frequency, -urgency Neurology: -headache, -weakness, -tingling, -numbness   Objective: Filed Vitals:   04/03/12 0807  BP: 138/90  Pulse: 84  Temp: 97.8 F (36.6 C)  Resp: 16    General appearance: alert, no distress, WD/WN  Neck: supple, no lymphadenopathy, no thyromegaly, no masses Heart: RRR, normal S1, S2, no murmurs Lungs: CTA bilaterally, no wheezes, rhonchi, or rales Abdomen: no bruits Pulses: 2+ symmetric, upper and lower extremities, normal cap refill Ext no edema   Assessment: Encounter Diagnoses  Name Primary?  . Essential hypertension, benign Yes  . Type II or unspecified type diabetes mellitus without mention of complication, not stated as uncontrolled      Plan: C/t Losartan 100/25, increase Cardizem CD to 300mg  daily.  F/u 95mo.  Advised strict diabetic diet, continue regular exercise.  F/u on diabetes in 61mo.

## 2012-04-12 ENCOUNTER — Telehealth: Payer: Self-pay | Admitting: Family Medicine

## 2012-04-12 NOTE — Telephone Encounter (Signed)
Message copied by Janeice Robinson on Fri Apr 12, 2012 11:37 AM ------      Message from: Aleen Campi, DAVID S      Created: Wed Apr 10, 2012  5:05 PM       Her updated BP numbers look better.  C/t current regimen            Recent BPs:      125/84      120/70      138/78      140/70      128/74      122/80      130/78 ------

## 2012-04-12 NOTE — Telephone Encounter (Signed)
I left Brenda [name] a message about her BP readings and continue on her same medications. CLS

## 2012-04-23 ENCOUNTER — Other Ambulatory Visit: Payer: Self-pay | Admitting: Medical

## 2012-07-12 ENCOUNTER — Ambulatory Visit (INDEPENDENT_AMBULATORY_CARE_PROVIDER_SITE_OTHER): Payer: 59 | Admitting: Medical

## 2012-07-12 ENCOUNTER — Encounter: Payer: Self-pay | Admitting: Medical

## 2012-07-12 VITALS — BP 130/78 | HR 68 | Temp 98.1°F | Resp 16 | Wt 187.0 lb

## 2012-07-12 DIAGNOSIS — E559 Vitamin D deficiency, unspecified: Secondary | ICD-10-CM

## 2012-07-12 DIAGNOSIS — E785 Hyperlipidemia, unspecified: Secondary | ICD-10-CM

## 2012-07-12 DIAGNOSIS — E119 Type 2 diabetes mellitus without complications: Secondary | ICD-10-CM

## 2012-07-12 DIAGNOSIS — I10 Essential (primary) hypertension: Secondary | ICD-10-CM

## 2012-07-12 LAB — HEMOGLOBIN A1C: Mean Plasma Glucose: 148 mg/dL — ABNORMAL HIGH (ref <117)

## 2012-07-12 LAB — LIPID PANEL
HDL: 43 mg/dL (ref >39)
LDL Cholesterol: 87 mg/dL (ref 0–99)
Total CHOL/HDL Ratio: 3.5 Ratio

## 2012-07-12 NOTE — Progress Notes (Signed)
Subjective: Here for f/u on blood pressure, cholesterol, blood sugar/diabetes.  Been doing well, eating careful diet, exercising routinely.  Compliant with medications, no issues with medication.  She does note improvements in BPs, less headaches. Lately BPs have been normal. No new c/o.    Past Medical History  Diagnosis Date  . Depression   . Vitamin D deficiency   . Osteoporosis     OSTEOPENIA  . Diabetes mellitus   . Hyperlipidemia   . Hypertension   . Former smoker    Review of Systems Constitutional: -fever, -chills, -sweats, -unexpected -weight change,-fatigue Cardiology:  -chest pain, -palpitations, -edema Respiratory: -cough, -shortness of breath, -wheezing Ophthalmology: -vision changes Urology: -dysuria, -difficulty urinating, -hematuria, -urinary frequency, -urgency Neurology: -headache, -weakness, -tingling, -numbness   Objective: Filed Vitals:   07/12/12 1423  BP: 130/78  Pulse: 68  Temp: 98.1 F (36.7 C)  Resp: 16    General appearance: alert, no distress, WD/WN  Neck: supple, no lymphadenopathy, no thyromegaly, no masses Heart: RRR, normal S1, S2, no murmurs Lungs: CTA bilaterally, no wheezes, rhonchi, or rales Abdomen: no bruits Pulses: 2+ symmetric, upper and lower extremities, normal cap refill Ext no edema   Assessment: Encounter Diagnoses  Name Primary?  . Essential hypertension, benign Yes  . Type II or unspecified type diabetes mellitus without mention of complication, not stated as uncontrolled   . Hyperlipidemia   . Unspecified vitamin D deficiency     Plan: C/t current medication, advised strict diabetic diet, continue regular exercise.  Labs today

## 2012-07-13 LAB — MICROALBUMIN / CREATININE URINE RATIO: Microalb, Ur: 0.5 mg/dL (ref 0.00–1.89)

## 2012-07-15 ENCOUNTER — Other Ambulatory Visit: Payer: Self-pay | Admitting: Medical

## 2012-07-15 ENCOUNTER — Encounter: Payer: Self-pay | Admitting: Medical

## 2012-07-15 MED ORDER — METFORMIN HCL 500 MG PO TABS: 500.0000 mg | ORAL_TABLET | Freq: Every day | ORAL | Status: DC

## 2012-07-27 ENCOUNTER — Other Ambulatory Visit: Payer: Self-pay | Admitting: Medical

## 2012-07-29 ENCOUNTER — Encounter: Payer: Self-pay | Admitting: Internal Medicine

## 2012-08-21 ENCOUNTER — Other Ambulatory Visit: Payer: Self-pay | Admitting: Medical

## 2012-08-29 ENCOUNTER — Other Ambulatory Visit (HOSPITAL_COMMUNITY): Payer: Self-pay | Admitting: Obstetrics

## 2012-08-29 DIAGNOSIS — Z1231 Encounter for screening mammogram for malignant neoplasm of breast: Secondary | ICD-10-CM

## 2012-09-17 ENCOUNTER — Telehealth: Payer: Self-pay | Admitting: Family Medicine

## 2012-09-17 ENCOUNTER — Encounter: Payer: Self-pay | Admitting: Medical

## 2012-09-17 NOTE — Telephone Encounter (Signed)
I reviewed her message.   How long has she been off her medication/Metformin?  Is she checking glucose?  What numbers is she getting?    Does she get the same nausea on just Metformin once daily, or 1/2 tablet daily?

## 2012-09-17 NOTE — Telephone Encounter (Signed)
Sent back this in pt email so she can reply

## 2012-09-17 NOTE — Telephone Encounter (Signed)
Brenda [name] cannot take the metformin any longer, [name] have been nauseated every time [name] take one. Unable to function with the extreme nausea. any other recommendations, [name] have a follow up appointment early next month. Brenda [name]

## 2012-09-18 NOTE — Telephone Encounter (Signed)
See msg below

## 2012-09-18 NOTE — Telephone Encounter (Signed)
Have her check some premeal glucose readings.   If she is ok trying metformin 1/2 tablet once daily, then try this. We can recheck HgbA1C and glucose numbers/recheck in 1-2 mo, or at least 34mo from last HgbA1C.

## 2012-09-18 NOTE — Telephone Encounter (Signed)
I sent the patient a message through My Chart. CLS

## 2012-09-18 NOTE — Telephone Encounter (Signed)
Pt has been off med for about 2 wks. She actually have not tried the 1/2 tab so maybe she will and see how it goes. her glucose levels are around 90-130 2 hrs after meals.

## 2012-09-23 ENCOUNTER — Ambulatory Visit (HOSPITAL_COMMUNITY)
Admission: RE | Admit: 2012-09-23 | Discharge: 2012-09-23 | Disposition: A | Discharge status: Home / Self Care | Payer: BC Managed Care – PPO | Source: Ambulatory Visit | Attending: Obstetrics | Admitting: Obstetrics

## 2012-09-23 DIAGNOSIS — Z1231 Encounter for screening mammogram for malignant neoplasm of breast: Secondary | ICD-10-CM | POA: Insufficient documentation

## 2012-10-21 ENCOUNTER — Encounter: Payer: Self-pay | Admitting: Medical

## 2012-10-21 ENCOUNTER — Telehealth: Payer: Self-pay | Admitting: Family Medicine

## 2012-10-21 ENCOUNTER — Ambulatory Visit (INDEPENDENT_AMBULATORY_CARE_PROVIDER_SITE_OTHER): Payer: 59 | Admitting: Medical

## 2012-10-21 VITALS — BP 140/70 | HR 78 | Temp 98.0°F | Resp 16 | Wt 185.0 lb

## 2012-10-21 DIAGNOSIS — I10 Essential (primary) hypertension: Secondary | ICD-10-CM

## 2012-10-21 DIAGNOSIS — E785 Hyperlipidemia, unspecified: Secondary | ICD-10-CM

## 2012-10-21 DIAGNOSIS — E559 Vitamin D deficiency, unspecified: Secondary | ICD-10-CM

## 2012-10-21 DIAGNOSIS — E119 Type 2 diabetes mellitus without complications: Secondary | ICD-10-CM

## 2012-10-21 DIAGNOSIS — F329 Major depressive disorder, single episode, unspecified: Secondary | ICD-10-CM

## 2012-10-21 MED ORDER — ALPRAZOLAM 0.25 MG PO TABS: ORAL_TABLET | ORAL | Status: DC

## 2012-10-21 MED ORDER — LOSARTAN POTASSIUM-HCTZ 100-25 MG PO TABS: 1.0000 | ORAL_TABLET | Freq: Every day | ORAL | Status: DC

## 2012-10-21 MED ORDER — BUPROPION HCL ER (XL) 150 MG PO TB24: 150.0000 mg | ORAL_TABLET | Freq: Every day | ORAL | Status: DC

## 2012-10-21 NOTE — Telephone Encounter (Signed)
Referral was ordered in EPIC. CLS

## 2012-10-21 NOTE — Addendum Note (Signed)
Addended by: Janeice Robinson on: 10/21/2012 03:43 PM   Modules accepted: Orders

## 2012-10-21 NOTE — Progress Notes (Signed)
Subjective: Here for f/u on blood pressure, blood sugar/diabetes.  Been doing well, eating careful diet, exercising routinely.  Compliant with medications, but since starting back on Metformin in May, has had persistent nausea, decreased appetite, and is only using once daily at 1/2 tablet daily.   Home glucose readings 130 or less pre meal.  Gets up to 186 two hours post prandial.  She is checking glucose regularly.   just went for eye doctor recently got new glasses.  Recently updated mammogram and pap, sees Dr. Marshall/gynecology.   BPs controlled at home <130/80s routinely.  Taking 2000 IU Vit D daily.  Mood has been down a little.  She attributes this to rainy days or when she thinks about her sister who passed.  But this is intermittent.  Mood usually is not down or depressed.  Past Medical History  Diagnosis Date  . Depression   . Vitamin D deficiency   . Osteoporosis     OSTEOPENIA  . Diabetes mellitus   . Hyperlipidemia   . Hypertension   . Former smoker    Review of Systems Constitutional: -fever, -chills, -sweats, -unexpected -weight change,-fatigue Cardiology:  -chest pain, -palpitations, -edema Respiratory: -cough, -shortness of breath, -wheezing Ophthalmology: -vision changes Urology: -dysuria, -difficulty urinating, -hematuria, -urinary frequency, -urgency Neurology: -headache, -weakness, -tingling, -numbness   Objective: Filed Vitals:   10/21/12 1500  BP: 140/70  Pulse: 78  Temp: 98 F (36.7 C)  Resp: 16    General appearance: alert, no distress, WD/WN  Neck: supple, no lymphadenopathy, no thyromegaly, no masses Heart: RRR, normal S1, S2, no murmurs Lungs: CTA bilaterally, no wheezes, rhonchi, or rales Abdomen: nontender, no mass or organomegaly Pulses: 2+ symmetric, upper and lower extremities, normal cap refill Ext no edema Foot exam - see separate   Assessment: Encounter Diagnoses  Name Primary?  . Type II or unspecified type diabetes mellitus without  mention of complication, not stated as uncontrolled Yes  . Essential hypertension, benign   . Hyperlipidemia   . Depression   . Unspecified vitamin D deficiency     Plan: DM type 2 - stop metformin given the nausea and anorexia.  C/t strict diabetic diet, continue regular exercise.  HgbA1C 7.1 today.  Referral to dietician, and get flu shot through work as planned.  HTN - c/t current medications.  Controlled per home readings. Hyperlipidemia - c/t current medications Depression - discussed her concerns, compliant with medication, advised she return if things worsen or having more frequently down days. Vit D deficiency - c/t OTC Vit D 2000 IU daily F/u 71mo

## 2012-10-21 NOTE — Telephone Encounter (Signed)
Message copied by Janeice Robinson on Mon Oct 21, 2012  3:43 PM ------      Message from: Jac Canavan      Created: Mon Oct 21, 2012  3:34 PM       Refer to dietician for diabetes diet education ------

## 2012-12-19 ENCOUNTER — Other Ambulatory Visit: Payer: Self-pay

## 2012-12-27 ENCOUNTER — Other Ambulatory Visit: Payer: Self-pay | Admitting: Medical

## 2013-04-16 ENCOUNTER — Ambulatory Visit (INDEPENDENT_AMBULATORY_CARE_PROVIDER_SITE_OTHER): Payer: BC Managed Care – PPO | Admitting: Medical

## 2013-04-16 ENCOUNTER — Encounter: Payer: Self-pay | Admitting: Medical

## 2013-04-16 VITALS — BP 138/80 | HR 100 | Temp 97.9°F | Resp 16 | Wt 188.2 lb

## 2013-04-16 DIAGNOSIS — E559 Vitamin D deficiency, unspecified: Secondary | ICD-10-CM

## 2013-04-16 DIAGNOSIS — I10 Essential (primary) hypertension: Secondary | ICD-10-CM

## 2013-04-16 DIAGNOSIS — E119 Type 2 diabetes mellitus without complications: Secondary | ICD-10-CM

## 2013-04-16 DIAGNOSIS — E785 Hyperlipidemia, unspecified: Secondary | ICD-10-CM

## 2013-04-16 LAB — COMPREHENSIVE METABOLIC PANEL
ALBUMIN: 4.3 g/dL (ref 3.5–5.2)
ALT: 16 U/L (ref 0–35)
AST: 19 U/L (ref 0–37)
Alkaline Phosphatase: 81 U/L (ref 39–117)
BUN: 11 mg/dL (ref 6–23)
CHLORIDE: 102 meq/L (ref 96–112)
CO2: 27 meq/L (ref 19–32)
Calcium: 9.7 mg/dL (ref 8.4–10.5)
Creat: 0.88 mg/dL (ref 0.50–1.10)
Glucose, Bld: 101 mg/dL — ABNORMAL HIGH (ref 70–99)
POTASSIUM: 3.9 meq/L (ref 3.5–5.3)
Sodium: 139 mEq/L (ref 135–145)
TOTAL PROTEIN: 6.9 g/dL (ref 6.0–8.3)
Total Bilirubin: 0.3 mg/dL (ref 0.2–1.2)

## 2013-04-16 LAB — CBC
HEMATOCRIT: 35.3 % — AB (ref 36.0–46.0)
Hemoglobin: 12.3 g/dL (ref 12.0–15.0)
MCH: 29.3 pg (ref 26.0–34.0)
MCHC: 34.8 g/dL (ref 30.0–36.0)
MCV: 84 fL (ref 78.0–100.0)
Platelets: 300 10*3/uL (ref 150–400)
RBC: 4.2 MIL/uL (ref 3.87–5.11)
RDW: 15.2 % (ref 11.5–15.5)
WBC: 7.6 10*3/uL (ref 4.0–10.5)

## 2013-04-16 LAB — LIPID PANEL
CHOLESTEROL: 159 mg/dL (ref 0–200)
HDL: 41 mg/dL (ref >39)
LDL Cholesterol: 103 mg/dL — ABNORMAL HIGH (ref 0–99)
Total CHOL/HDL Ratio: 3.9 Ratio
Triglycerides: 75 mg/dL (ref <150)
VLDL: 15 mg/dL (ref 0–40)

## 2013-04-16 LAB — HEMOGLOBIN A1C
Hgb A1c MFr Bld: 7.3 % — ABNORMAL HIGH (ref <5.7)
MEAN PLASMA GLUCOSE: 163 mg/dL — AB (ref <117)

## 2013-04-16 MED ORDER — DILTIAZEM HCL ER COATED BEADS 300 MG PO CP24: 300.0000 mg | ORAL_CAPSULE | Freq: Every day | ORAL | Status: DC

## 2013-04-16 MED ORDER — SERTRALINE HCL 100 MG PO TABS: 100.0000 mg | ORAL_TABLET | Freq: Every day | ORAL | Status: DC

## 2013-04-16 MED ORDER — LOSARTAN POTASSIUM-HCTZ 100-25 MG PO TABS: 1.0000 | ORAL_TABLET | Freq: Every day | ORAL | Status: DC

## 2013-04-16 MED ORDER — BUPROPION HCL ER (XL) 150 MG PO TB24: 150.0000 mg | ORAL_TABLET | Freq: Every day | ORAL | Status: DC

## 2013-04-16 MED ORDER — ATORVASTATIN CALCIUM 80 MG PO TABS: 80.0000 mg | ORAL_TABLET | Freq: Every day | ORAL | Status: DC

## 2013-04-16 NOTE — Assessment & Plan Note (Signed)
compliant with medication, exercising with walking, eating healthy, avoiding salt, SBP in the 130s, DBP in the 80s

## 2013-04-16 NOTE — Progress Notes (Signed)
   Subjective:   Brenda [name] is a 63 y.o. female presenting on 04/16/2013 with Medication Refill and medication check  Type II or unspecified type diabetes mellitus without mention of complication, not stated as uncontrolled Eating healthy, exercising.  Checking every few days.   2 hour post prandials 94-110.  Has seen eye doctor in the last year.  Checking feet daily.  No worries.  Currently diet controlled.   HTN (hypertension) compliant with medication, exercising with walking, eating healthy, avoiding salt, SBP in the 130s, DBP in the 80s  Hyperlipidemia Compliant with medication without c/o.   See gynecology, Dr. Francoise CeoBernard Marshall, up to date on pap, mammogram.  No other aggravating or relieving factors.  No other complaint.  Review of Systems ROS as in subjective      Objective:     Filed Vitals:   04/16/13 1516  BP: 138/80  Pulse: 100  Temp: 97.9 F (36.6 C)  Resp: 16    General appearance: alert, no distress, WD/WN Neck: supple, no lymphadenopathy, no thyromegaly, no masses Heart: RRR, normal S1, S2, no murmurs Lungs: CTA bilaterally, no wheezes, rhonchi, or rales Abdomen: +bs, soft, non tender, non distended, no masses, no hepatomegaly, no splenomegaly Pulses: 2+ symmetric, upper and lower extremities, normal cap refill Ext: no edema  See separate foot exam     Assessment: Encounter Diagnoses  Name Primary?  . Type II or unspecified type diabetes mellitus without mention of complication, not stated as uncontrolled Yes  . HTN (hypertension)   . Hyperlipidemia   . Unspecified vitamin D deficiency      Plan: Doing well on current regimen, eating healthy, exercising.  C/t healthy diet ,see eye doctor yearly, daily foot checks.  Discussed goals of diabetes care.   Brenda Hashimotoatricia was seen today for medication refill and medication check.  Diagnoses and associated orders for this visit:  Type II or unspecified type diabetes mellitus without mention  of complication, not stated as uncontrolled - Comprehensive metabolic panel - Hemoglobin A1c - HM DIABETES FOOT EXAM  HTN (hypertension) - CBC  Hyperlipidemia - Lipid panel  Unspecified vitamin D deficiency - Vit D  25 hydroxy (rtn osteoporosis monitoring)     Return pendign labs.

## 2013-04-16 NOTE — Assessment & Plan Note (Signed)
Compliant with medication without c/o. 

## 2013-04-16 NOTE — Assessment & Plan Note (Addendum)
Eating healthy, exercising.  Checking every few days.   2 hour post prandials 94-110.  Has seen eye doctor in the last year.  Checking feet daily.  No worries.  Currently diet controlled.

## 2013-04-17 ENCOUNTER — Other Ambulatory Visit: Payer: Self-pay | Admitting: Medical

## 2013-04-17 LAB — VITAMIN D 25 HYDROXY (VIT D DEFICIENCY, FRACTURES): Vit D, 25-Hydroxy: 56 ng/mL (ref 30–89)

## 2013-04-17 MED ORDER — SITAGLIPTIN PHOSPHATE 25 MG PO TABS: 25.0000 mg | ORAL_TABLET | Freq: Every day | ORAL | Status: DC

## 2013-04-18 NOTE — Progress Notes (Signed)
lmom to cb. CLS 

## 2013-04-21 ENCOUNTER — Ambulatory Visit: Payer: BC Managed Care – PPO | Admitting: Medical

## 2013-07-18 ENCOUNTER — Ambulatory Visit: Payer: BC Managed Care – PPO | Admitting: Medical

## 2013-08-12 ENCOUNTER — Ambulatory Visit (INDEPENDENT_AMBULATORY_CARE_PROVIDER_SITE_OTHER): Payer: BC Managed Care – PPO | Admitting: Medical

## 2013-08-12 ENCOUNTER — Encounter: Payer: Self-pay | Admitting: Medical

## 2013-08-12 VITALS — BP 112/70 | HR 92 | Temp 98.1°F | Resp 16 | Wt 188.0 lb

## 2013-08-12 DIAGNOSIS — I10 Essential (primary) hypertension: Secondary | ICD-10-CM

## 2013-08-12 DIAGNOSIS — E119 Type 2 diabetes mellitus without complications: Secondary | ICD-10-CM

## 2013-08-12 DIAGNOSIS — R51 Headache: Secondary | ICD-10-CM

## 2013-08-12 DIAGNOSIS — E785 Hyperlipidemia, unspecified: Secondary | ICD-10-CM

## 2013-08-12 LAB — POCT GLYCOSYLATED HEMOGLOBIN (HGB A1C): Hemoglobin A1C: 6.5

## 2013-08-12 LAB — LIPID PANEL
CHOL/HDL RATIO: 3.6 ratio
Cholesterol: 174 mg/dL (ref 0–200)
HDL: 49 mg/dL (ref >39)
LDL CALC: 104 mg/dL — AB (ref 0–99)
TRIGLYCERIDES: 105 mg/dL (ref <150)
VLDL: 21 mg/dL (ref 0–40)

## 2013-08-12 NOTE — Progress Notes (Signed)
  Subjective:   Brenda [name] is an 63 y.o. female who presents for follow up of Type 2 diabetes mellitus, HTN, hyperlipidemia.   Patient is checking home blood sugars.   Home blood sugar records: 2 hours postprandial 140-160, fasting below 100. Current symptoms include: none. Patient denies polyphagia, polydipsia, polyuria. Denies numbness, tingling in extremities, blurred vision. Patient is checking their feet daily. Foot concerns (callous, ulcer, wound, thickened nails, toenail fungus, skin fungus, hammer toe): no concerns Last dilated eye exam last year, she is due in August.  Current treatments: Continued Januvia 25 mg which has been effective. Need a refill. Occasionally, she gets a headache when she takes the Januvia in the morning. She doesn't get headaches if she takes Januvia at night. She has not needed any OTC. medications for the headaches. Medication compliance: good  Current diet: well balanced has not consistently eaten well especially in the last 6 weeks. She has been taking a CNA class so she was unable to focus on eating well. Current exercise: walking 3X/week 1-3 miles.  Known diabetic complications: none  Concerns: The price of her Januvia is going to go up 9 months from now to $100. However, she is not currently interested in changing the medication but would like to revisit other options later on.  The following portions of the patient's history were reviewed and updated as appropriate: allergies, current medications, past family history, past medical history, past social history, past surgical history and problem list.   ROS as in subjective above    Objective:   BP 112/70  Pulse 92  Temp(Src) 98.1 F (36.7 C) (Oral)  Resp 16  Wt 188 lb (85.276 kg)  General appearance: alert, no distress, WD/WN Neck: supple, no lymphadenopathy, no thyromegaly, no masses Heart: RRR, normal S1, S2, no murmurs Lungs: CTA bilaterally, no wheezes, rhonchi, or  rales Abdomen: +bs, soft, non tender, non distended, no masses, no hepatomegaly, no splenomegaly Pulses: 2+ symmetric, upper and lower extremities, normal cap refill Foot exam: Dorsalis Pedis and Posterior Tibial pulses are 2+ bilaterally, skin is warm, dry, without breaks or ulcerations, sensation intact   Assessment:   Encounter Diagnoses  Name Primary?  . Type II or unspecified type diabetes mellitus without mention of complication, not stated as uncontrolled Yes  . Hyperlipidemia   . Essential hypertension, benign   . RUEAVWUJ(811.9Headache(784.0)      Plan:   Diabetes Mellitus type 2: Education: Reviewed 'ABCs' of diabetes management (respective goals in parentheses):  A1C (<7), blood pressure (<130/80), and cholesterol (LDL <100)   Diabetes mellitus Type II, under good control.  c/t [place]uvia, but if headaches c/t to be a problem, consider changing to alternate medication.  Compliance at present is estimated to be excellent. Efforts to improve compliance (if necessary) will be directed at dietary modifications: healthy diabetic diet, avoiding processed foods, avoiding high cholesterol foods.    Blood pressure: normal blood pressure .   An ACE/ARB is currently part of their treatment regimen.   Dyslipidemia under good control. .  A statin is currently part of their treatment regimen.   Discussed general issues about diabetes pathophysiology and management. Addressed ADA diet. Suggested low cholesterol diet. Encouraged aerobic exercise. Discussed foot care. Reminded to get yearly retinal exam.    Follow up: pending labs

## 2013-08-14 ENCOUNTER — Encounter: Payer: Self-pay | Admitting: Medical

## 2013-08-14 ENCOUNTER — Other Ambulatory Visit: Payer: Self-pay | Admitting: Medical

## 2013-08-14 MED ORDER — ATORVASTATIN CALCIUM 80 MG PO TABS: 80.0000 mg | ORAL_TABLET | Freq: Every day | ORAL | Status: DC

## 2013-08-14 MED ORDER — ERGOCALCIFEROL 1.25 MG (50000 UT) PO CAPS: 50000.0000 [IU] | ORAL_CAPSULE | ORAL | Status: DC

## 2013-08-14 MED ORDER — SITAGLIPTIN PHOSPHATE 25 MG PO TABS: 25.0000 mg | ORAL_TABLET | Freq: Every day | ORAL | Status: DC

## 2013-08-14 MED ORDER — LOSARTAN POTASSIUM-HCTZ 100-25 MG PO TABS: 1.0000 | ORAL_TABLET | Freq: Every day | ORAL | Status: DC

## 2013-08-14 MED ORDER — DILTIAZEM HCL ER COATED BEADS 300 MG PO CP24: 300.0000 mg | ORAL_CAPSULE | Freq: Every day | ORAL | Status: DC

## 2013-08-14 MED ORDER — BUPROPION HCL ER (XL) 150 MG PO TB24: 150.0000 mg | ORAL_TABLET | Freq: Every day | ORAL | Status: DC

## 2013-08-14 MED ORDER — SERTRALINE HCL 100 MG PO TABS: 100.0000 mg | ORAL_TABLET | Freq: Every day | ORAL | Status: DC

## 2013-08-14 MED ORDER — ALPRAZOLAM 0.25 MG PO TABS: ORAL_TABLET | ORAL | Status: DC

## 2013-08-14 NOTE — Progress Notes (Signed)
Wendy please handle 

## 2013-08-14 NOTE — Progress Notes (Signed)
Called Xanax to pharmacy as directed.

## 2013-08-21 ENCOUNTER — Telehealth: Payer: Self-pay | Admitting: Medical

## 2013-08-21 NOTE — Telephone Encounter (Signed)
Pt out of meds, samples of Januvia 100mg  1/4 to 1/2 qd per Vincenza HewsShane

## 2013-08-22 MED ORDER — SITAGLIPTIN PHOSPHATE 100 MG PO TABS: ORAL_TABLET | ORAL | Status: DC

## 2013-08-26 ENCOUNTER — Telehealth: Payer: Self-pay | Admitting: Medical

## 2013-08-26 ENCOUNTER — Other Ambulatory Visit: Payer: Self-pay | Admitting: Medical

## 2013-08-26 MED ORDER — SAXAGLIPTIN HCL 5 MG PO TABS: 5.0000 mg | ORAL_TABLET | Freq: Every day | ORAL | Status: DC

## 2013-08-26 NOTE — Telephone Encounter (Signed)
Left message on pt VM will info

## 2013-08-26 NOTE — Telephone Encounter (Signed)
Pt insurance still denied [place]ajanuvia. Pt is too try onglyza. shane has sent new med in.

## 2013-08-26 NOTE — Telephone Encounter (Signed)
I received prior authorization that insurance would not cover Januvia, but the document I received says they will cover Onglyza which is a similar medication. Does begin Onglyza once daily instead of Januvia, let me know if glucose not staying under 130 in the morning

## 2013-09-23 ENCOUNTER — Other Ambulatory Visit (HOSPITAL_COMMUNITY): Payer: Self-pay | Admitting: Obstetrics

## 2013-09-23 DIAGNOSIS — Z1231 Encounter for screening mammogram for malignant neoplasm of breast: Secondary | ICD-10-CM

## 2013-10-07 ENCOUNTER — Ambulatory Visit (HOSPITAL_COMMUNITY)
Admission: RE | Admit: 2013-10-07 | Discharge: 2013-10-07 | Disposition: A | Discharge status: Home / Self Care | Payer: BC Managed Care – PPO | Source: Ambulatory Visit | Attending: Obstetrics | Admitting: Obstetrics

## 2013-10-07 DIAGNOSIS — Z1231 Encounter for screening mammogram for malignant neoplasm of breast: Secondary | ICD-10-CM | POA: Diagnosis not present

## 2013-11-07 ENCOUNTER — Encounter: Payer: Self-pay | Admitting: Internal Medicine

## 2013-12-25 LAB — HM DIABETES EYE EXAM

## 2014-01-15 ENCOUNTER — Encounter: Payer: Self-pay | Admitting: Medical

## 2014-01-15 ENCOUNTER — Ambulatory Visit (INDEPENDENT_AMBULATORY_CARE_PROVIDER_SITE_OTHER): Payer: BC Managed Care – PPO | Admitting: Medical

## 2014-01-15 VITALS — BP 150/82 | HR 78 | Temp 97.8°F | Resp 14 | Wt 188.0 lb

## 2014-01-15 DIAGNOSIS — M7070 Other bursitis of hip, unspecified hip: Secondary | ICD-10-CM

## 2014-01-15 DIAGNOSIS — M2550 Pain in unspecified joint: Secondary | ICD-10-CM

## 2014-01-15 DIAGNOSIS — Z1211 Encounter for screening for malignant neoplasm of colon: Secondary | ICD-10-CM

## 2014-01-15 DIAGNOSIS — I10 Essential (primary) hypertension: Secondary | ICD-10-CM

## 2014-01-15 DIAGNOSIS — E119 Type 2 diabetes mellitus without complications: Secondary | ICD-10-CM | POA: Insufficient documentation

## 2014-01-15 DIAGNOSIS — E785 Hyperlipidemia, unspecified: Secondary | ICD-10-CM

## 2014-01-15 LAB — COMPREHENSIVE METABOLIC PANEL
ALBUMIN: 3.8 g/dL (ref 3.5–5.2)
ALT: 11 U/L (ref 0–35)
AST: 17 U/L (ref 0–37)
Alkaline Phosphatase: 79 U/L (ref 39–117)
BUN: 12 mg/dL (ref 6–23)
CHLORIDE: 102 meq/L (ref 96–112)
CO2: 28 mEq/L (ref 19–32)
Calcium: 9.6 mg/dL (ref 8.4–10.5)
Creat: 0.87 mg/dL (ref 0.50–1.10)
Glucose, Bld: 102 mg/dL — ABNORMAL HIGH (ref 70–99)
POTASSIUM: 4 meq/L (ref 3.5–5.3)
Sodium: 138 mEq/L (ref 135–145)
Total Bilirubin: 0.4 mg/dL (ref 0.2–1.2)
Total Protein: 6.8 g/dL (ref 6.0–8.3)

## 2014-01-15 LAB — LIPID PANEL
Cholesterol: 167 mg/dL (ref 0–200)
HDL: 46 mg/dL (ref >39)
LDL Cholesterol: 100 mg/dL — ABNORMAL HIGH (ref 0–99)
Total CHOL/HDL Ratio: 3.6 Ratio
Triglycerides: 105 mg/dL (ref <150)
VLDL: 21 mg/dL (ref 0–40)

## 2014-01-15 LAB — POCT GLYCOSYLATED HEMOGLOBIN (HGB A1C): HEMOGLOBIN A1C: 6.6

## 2014-01-15 MED ORDER — SERTRALINE HCL 100 MG PO TABS: 100.0000 mg | ORAL_TABLET | Freq: Every day | ORAL | Status: DC

## 2014-01-15 MED ORDER — DILTIAZEM HCL ER COATED BEADS 300 MG PO CP24: 300.0000 mg | ORAL_CAPSULE | Freq: Every day | ORAL | Status: DC

## 2014-01-15 MED ORDER — ERGOCALCIFEROL 1.25 MG (50000 UT) PO CAPS: 50000.0000 [IU] | ORAL_CAPSULE | ORAL | Status: DC

## 2014-01-15 MED ORDER — BUPROPION HCL ER (XL) 150 MG PO TB24: 150.0000 mg | ORAL_TABLET | Freq: Every day | ORAL | Status: DC

## 2014-01-15 MED ORDER — LOSARTAN POTASSIUM-HCTZ 100-25 MG PO TABS: 1.0000 | ORAL_TABLET | Freq: Every day | ORAL | Status: DC

## 2014-01-15 NOTE — Progress Notes (Addendum)
Subjective:   Brenda [name] is an 63 y.o. female who presents for follow up of chronic issues, med check and other questions.   Main concern today is joint pains.  Been on Onglyza x months, but in the last several weeks having lots of pains in bilat hips, left knee, left ankle without swelling, injury, fall, trauma.  No prior issues with joint pains.   No hand or wrist pains.   No change in activity level.  Still exercises. No headaches, no shoulder problems.  No joint weakness.    Diabetes type II  - since last visit Januvia wasn't covered.  We changed from Metformin as this was giving her excessive nausea.  Patient is checking home blood sugars.   Home blood sugar records: 84 to 101  After eating 130-160 Current symptoms include: . Patient denies having some joint pain for the past 6 weeks.  Patient is checking their feet daily. Foot concerns (callous, ulcer, wound, thickened nails, toenail fungus, skin fungus, hammer toe): nothing Last dilated eye exam 12/2013  Current treatments: onglyza daily x last 4 months Medication compliance: good  Current diet: in general, a "healthy" diet   Current exercise: walking Known diabetic complications: joint pains for the past 6 weeks  Hypertension - compliant with medication without c/o  Hyperlipdiema - compliant with medication without c/o  Needs screenings done per work/insurance before end of the year.   She is seeing gyn soon, needs FOBT as she refuses any more colonoscopies.    Having some runny nose and sneezing, allergy problems in recent weeks, no prior similar.   Doesn't normally have allergy problems.   No new pets.     The following portions of the patient's history were reviewed and updated as appropriate: allergies, current medications, past family history, past medical history, past social history, past surgical history and problem list.  ROS as in subjective above    Objective:   Filed Vitals:   01/15/14 1517  BP:  150/82  Pulse: 78  Temp: 97.8 F (36.6 C)  Resp: 14   General appearance: alert, no distress, WD/WN HEENT: normocephalic, sclerae anicteric, TMs not visualized due to cerumen, nares patent, no discharge or erythema, pharynx normal Oral cavity: MMM, no lesions Neck: supple, no lymphadenopathy, no thyromegaly, no masses Heart: RRR, normal S1, S2, no murmurs Lungs: CTA bilaterally, no wheezes, rhonchi, or rales Pulses: 2+ symmetric, upper and lower extremities, normal cap refill Ext: no edema MSK: mild tenderness over bilat trochanteric bursa, otherwise ankles and knees nontender, normal ROM of legs throughout, no deformity Neuro: normal leg strength, sensation, DTRs.     Assessment:   Encounter Diagnoses  Name Primary?  . Diabetes type 2, controlled Yes  . Hyperlipidemia   . Essential hypertension   . Joint pain   . Hip bursitis, unspecified laterality   . Special screening for malignant neoplasms, colon      Plan:   DM type 2 - c/t healthy diet, routine exercise, daily foot checks, yearly eye exams.   Will call back with medication options.  She wants to stop Onglyza given joint pains.    Hyperlipidemia - c/t same medication, labs today.  HTN - c/t same medication.   BP up today she attributes to stress of losing her wallet before she came in today.   Home BPs normal.   Joint pains, hip bursitis - she attributes to Onglyza and there is new reports in the news about this.  She will stop Onglyza  for now.   Will consider options and call her back.  Advised short term OTC NSAID, relative rest, and discussed other possible causes of joint pains . Limit stairs when possible short term given the hip bursitis.   Gave info on Cologuard as alternative for colorectal cancer screening.   She will check insurance coverage.  Also gave FOBT kit to collect and return as second option too.  Discussed risks/benefits of each type of screening.   F/u pending labs

## 2014-01-16 ENCOUNTER — Other Ambulatory Visit: Payer: Self-pay | Admitting: Medical

## 2014-01-16 LAB — MICROALBUMIN / CREATININE URINE RATIO
CREATININE, URINE: 146.5 mg/dL
MICROALB UR: 0.5 mg/dL (ref <2.0)
Microalb Creat Ratio: 3.4 mg/g (ref 0.0–30.0)

## 2014-01-16 MED ORDER — ATORVASTATIN CALCIUM 80 MG PO TABS: 80.0000 mg | ORAL_TABLET | Freq: Every day | ORAL | Status: DC

## 2014-01-19 ENCOUNTER — Encounter: Payer: Self-pay | Admitting: Medical

## 2014-01-21 ENCOUNTER — Other Ambulatory Visit (INDEPENDENT_AMBULATORY_CARE_PROVIDER_SITE_OTHER): Payer: BC Managed Care – PPO

## 2014-01-21 DIAGNOSIS — Z1211 Encounter for screening for malignant neoplasm of colon: Secondary | ICD-10-CM

## 2014-01-21 LAB — HEMOCCULT GUIAC POC 1CARD (OFFICE)
Card #2 Fecal Occult Blod, POC: NEGATIVE
Card #3 Fecal Occult Blood, POC: NEGATIVE
FECAL OCCULT BLD: NEGATIVE

## 2014-02-14 ENCOUNTER — Other Ambulatory Visit: Payer: Self-pay | Admitting: Medical

## 2014-02-16 NOTE — Telephone Encounter (Signed)
Call out xanax #[phone] with 1 refill

## 2014-02-16 NOTE — Telephone Encounter (Signed)
Sent to [facility]

## 2014-02-16 NOTE — Telephone Encounter (Signed)
Is this okay to refill? 

## 2014-03-21 ENCOUNTER — Other Ambulatory Visit: Payer: Self-pay | Admitting: Medical

## 2014-03-23 ENCOUNTER — Encounter: Payer: Self-pay | Admitting: Medical

## 2014-03-23 NOTE — Telephone Encounter (Signed)
Brenda [name] - see message.  It appears we called out Xanax 02/16/14 with a refill.  Please call pharmacy to inquire.

## 2014-05-20 ENCOUNTER — Ambulatory Visit: Payer: Self-pay | Admitting: Medical

## 2014-05-29 ENCOUNTER — Telehealth: Payer: Self-pay | Admitting: Medical

## 2014-05-29 ENCOUNTER — Ambulatory Visit
Admission: RE | Admit: 2014-05-29 | Discharge: 2014-05-29 | Disposition: A | Discharge status: Home / Self Care | Payer: BLUE CROSS/BLUE SHIELD | Source: Ambulatory Visit | Attending: Medical | Admitting: Medical

## 2014-05-29 ENCOUNTER — Encounter: Payer: Self-pay | Admitting: Medical

## 2014-05-29 ENCOUNTER — Ambulatory Visit (INDEPENDENT_AMBULATORY_CARE_PROVIDER_SITE_OTHER): Payer: BLUE CROSS/BLUE SHIELD | Admitting: Medical

## 2014-05-29 VITALS — BP 130/70 | HR 83 | Resp 14 | Wt 193.0 lb

## 2014-05-29 DIAGNOSIS — E119 Type 2 diabetes mellitus without complications: Secondary | ICD-10-CM

## 2014-05-29 DIAGNOSIS — F32A Depression, unspecified: Secondary | ICD-10-CM

## 2014-05-29 DIAGNOSIS — M25562 Pain in left knee: Secondary | ICD-10-CM

## 2014-05-29 DIAGNOSIS — M25462 Effusion, left knee: Secondary | ICD-10-CM | POA: Diagnosis not present

## 2014-05-29 DIAGNOSIS — F329 Major depressive disorder, single episode, unspecified: Secondary | ICD-10-CM

## 2014-05-29 DIAGNOSIS — E785 Hyperlipidemia, unspecified: Secondary | ICD-10-CM | POA: Diagnosis not present

## 2014-05-29 DIAGNOSIS — I10 Essential (primary) hypertension: Secondary | ICD-10-CM

## 2014-05-29 NOTE — Progress Notes (Signed)
  Subjective:   Brenda [name] is an 64 y.o. female who presents for follow up of Type 2 diabetes mellitus, HTN, lipids, medication and left knee pain.  Patient is checking home blood sugars.   Home blood sugar records: 109 TO 114 BEFORE AROUND 150 AFTER EATING Current symptoms include: none. Patient denies NONE.  Patient is checking their feet daily. Foot concerns (callous, ulcer, wound, thickened nails, toenail fungus, skin fungus, hammer toe): NO CONCERNS Last dilated eye exam 8/ 2015  Current treatments: no medications currently Medication compliance: good  Current diet: well balanced Current exercise: none Known diabetic complications: none  Left knee keeps bothering her.  Sometimes seems to swell.  No particular injury.  Sees Dr. Migdalia DkBernar Marshall for gynecology, up to date on pap and mammogram.   Declines colonoscopy.   The following portions of the patient's history were reviewed and updated as appropriate: allergies, current medications, past family history, past medical history, past social history, past surgical history and problem list.  ROS as in subjective above    Objective:   BP 130/70 mmHg  Pulse 83  Resp 14  Wt 193 lb (87.544 kg)  Wt Readings from Last 3 Encounters:  05/29/14 193 lb (87.544 kg)  01/15/14 188 lb (85.276 kg)  08/12/13 188 lb (85.276 kg)   BP Readings from Last 3 Encounters:  05/29/14 130/70  01/15/14 150/82  08/12/13 112/70   Filed Vitals:   05/29/14 1417  BP: 130/70  Pulse: 83  Resp: 14    General appearance: alert, no distress, WD/WN Neck: supple, no lymphadenopathy, no thyromegaly, no masses Heart: RRR, normal S1, S2, no murmurs Lungs: CTA bilaterally, no wheezes, rhonchi, or rales Abdomen: +bs, soft, non tender, non distended, no masses, no hepatomegaly, no splenomegaly Pulses: 2+ symmetric, upper and lower extremities, normal cap refill Ext: no edema MSK: tender over left knee joint and patella, slight prepatellar bursa  swelling, click felt with knee extension, and patella seems to rub along femur with ROM, otherwise no laxity, no other swelling, rest of bilat leg exam unremarkable Legs neurovascular intact    Assessment:   Encounter Diagnoses  Name Primary?  . Left knee pain Yes  . Knee swelling, left   . Diabetes type 2, controlled   . Essential hypertension   . Hyperlipidemia   . Depression      Plan:   Diabetes Mellitus type 2: hgba1C 6.9% today.   C/t healthy diet, routine exercise, no medication at this time, c/t checking glucose a few times per week, yearly eye doctor visit.  HTN - c/t same medication  hyperlipidemia - c/t same medication, reviewed last labs  Knee pain and swelling - exam suggests prepatellar bursitis, patellofemoral syndrome, but there is a click and can't rule out other pathology.  Will send for xray.  Over the weekend can use ice, ibuprofen, elevation.   Depression - c/t same medication

## 2014-05-29 NOTE — Telephone Encounter (Signed)
pls call or check with the Cologuard lady to see if we could help Mrs. Brenda [name] get the cologuard done.  Insurance may have exclusion.  Looking or a reasonable priced option

## 2014-06-01 ENCOUNTER — Other Ambulatory Visit: Payer: Self-pay | Admitting: Medical

## 2014-06-01 MED ORDER — MELOXICAM 15 MG PO TABS: 15.0000 mg | ORAL_TABLET | Freq: Every day | ORAL | Status: DC

## 2014-06-02 NOTE — Telephone Encounter (Signed)
I check with the Cologuard rep. And she states that for [employer]da [employer] insurance its going $600.00. Patient has [employer] cologuard does not cover [employer] and cologuard is in a lawsuit with [employer] currently.

## 2014-06-04 NOTE — Telephone Encounter (Signed)
Please let patient know about the cost, so I guess this is on the back burner, but if we haven't done recent stool cards x 3, then have her do them

## 2014-06-09 NOTE — Telephone Encounter (Signed)
Patient states that her knee is about 75% better so she states that she will hold off on the PT for now.  Patient is aware of the message about cologuard and she states that she was aware of all of that. Patient states she just did a stool x 3 cards in 01/2014.

## 2014-06-09 NOTE — Telephone Encounter (Signed)
LMOM TO CB. CLS 

## 2014-06-10 NOTE — Telephone Encounter (Signed)
That is correct, I saw the 01/2014 negative stool cards .

## 2014-10-08 ENCOUNTER — Other Ambulatory Visit (HOSPITAL_COMMUNITY): Payer: Self-pay | Admitting: Obstetrics

## 2014-10-08 DIAGNOSIS — Z1231 Encounter for screening mammogram for malignant neoplasm of breast: Secondary | ICD-10-CM

## 2014-10-22 ENCOUNTER — Ambulatory Visit (HOSPITAL_COMMUNITY)
Admission: RE | Admit: 2014-10-22 | Discharge: 2014-10-22 | Disposition: A | Discharge status: Home / Self Care | Payer: Commercial Managed Care - HMO | Source: Ambulatory Visit | Attending: Obstetrics | Admitting: Obstetrics

## 2014-10-22 DIAGNOSIS — Z1231 Encounter for screening mammogram for malignant neoplasm of breast: Secondary | ICD-10-CM | POA: Insufficient documentation

## 2014-10-22 LAB — HM MAMMOGRAPHY: HM Mammogram: NORMAL

## 2014-11-18 LAB — PROCEDURE REPORT - SCANNED: PAP SMEAR: NEGATIVE

## 2014-11-25 ENCOUNTER — Ambulatory Visit (INDEPENDENT_AMBULATORY_CARE_PROVIDER_SITE_OTHER): Payer: Commercial Managed Care - HMO | Admitting: Medical

## 2014-11-25 ENCOUNTER — Telehealth: Payer: Self-pay | Admitting: Medical

## 2014-11-25 ENCOUNTER — Encounter: Payer: Self-pay | Admitting: Medical

## 2014-11-25 VITALS — BP 142/90 | HR 87 | Resp 12 | Ht 67.0 in | Wt 189.0 lb

## 2014-11-25 DIAGNOSIS — E785 Hyperlipidemia, unspecified: Secondary | ICD-10-CM | POA: Diagnosis not present

## 2014-11-25 DIAGNOSIS — Z7289 Other problems related to lifestyle: Secondary | ICD-10-CM | POA: Insufficient documentation

## 2014-11-25 DIAGNOSIS — Z1211 Encounter for screening for malignant neoplasm of colon: Secondary | ICD-10-CM

## 2014-11-25 DIAGNOSIS — Z789 Other specified health status: Secondary | ICD-10-CM

## 2014-11-25 DIAGNOSIS — I10 Essential (primary) hypertension: Secondary | ICD-10-CM

## 2014-11-25 DIAGNOSIS — Z0000 Encounter for general adult medical examination without abnormal findings: Secondary | ICD-10-CM | POA: Insufficient documentation

## 2014-11-25 DIAGNOSIS — E119 Type 2 diabetes mellitus without complications: Secondary | ICD-10-CM

## 2014-11-25 DIAGNOSIS — M25561 Pain in right knee: Secondary | ICD-10-CM | POA: Insufficient documentation

## 2014-11-25 DIAGNOSIS — M8580 Other specified disorders of bone density and structure, unspecified site: Secondary | ICD-10-CM | POA: Diagnosis not present

## 2014-11-25 DIAGNOSIS — F411 Generalized anxiety disorder: Secondary | ICD-10-CM | POA: Diagnosis not present

## 2014-11-25 DIAGNOSIS — E559 Vitamin D deficiency, unspecified: Secondary | ICD-10-CM | POA: Diagnosis not present

## 2014-11-25 DIAGNOSIS — Z87891 Personal history of nicotine dependence: Secondary | ICD-10-CM | POA: Insufficient documentation

## 2014-11-25 DIAGNOSIS — F325 Major depressive disorder, single episode, in full remission: Secondary | ICD-10-CM

## 2014-11-25 LAB — POCT URINALYSIS DIPSTICK
Bilirubin, UA: NEGATIVE
Blood, UA: NEGATIVE
Glucose, UA: NEGATIVE
Ketones, UA: NEGATIVE
Leukocytes, UA: NEGATIVE
NITRITE UA: NEGATIVE
PH UA: 5.5
PROTEIN UA: NEGATIVE
Spec Grav, UA: 1.02
Urobilinogen, UA: NEGATIVE

## 2014-11-25 LAB — LIPID PANEL
CHOL/HDL RATIO: 3.7 ratio (ref <=5.0)
Cholesterol: 169 mg/dL (ref 125–200)
HDL: 46 mg/dL (ref >=46)
LDL Cholesterol: 103 mg/dL (ref <130)
Triglycerides: 100 mg/dL (ref <150)
VLDL: 20 mg/dL (ref <30)

## 2014-11-25 LAB — COMPREHENSIVE METABOLIC PANEL
ALBUMIN: 4 g/dL (ref 3.6–5.1)
ALK PHOS: 89 U/L (ref 33–130)
ALT: 13 U/L (ref 6–29)
AST: 13 U/L (ref 10–35)
BUN: 13 mg/dL (ref 7–25)
CALCIUM: 9.3 mg/dL (ref 8.6–10.4)
CO2: 26 mmol/L (ref 20–31)
CREATININE: 0.84 mg/dL (ref 0.50–0.99)
Chloride: 103 mmol/L (ref 98–110)
Glucose, Bld: 187 mg/dL — ABNORMAL HIGH (ref 65–99)
Potassium: 3.6 mmol/L (ref 3.5–5.3)
Sodium: 139 mmol/L (ref 135–146)
Total Bilirubin: 0.4 mg/dL (ref 0.2–1.2)
Total Protein: 6.7 g/dL (ref 6.1–8.1)

## 2014-11-25 LAB — CBC
HEMATOCRIT: 37 % (ref 36.0–46.0)
Hemoglobin: 12.8 g/dL (ref 12.0–15.0)
MCH: 29.5 pg (ref 26.0–34.0)
MCHC: 34.6 g/dL (ref 30.0–36.0)
MCV: 85.3 fL (ref 78.0–100.0)
MPV: 10.5 fL (ref 8.6–12.4)
Platelets: 283 10*3/uL (ref 150–400)
RBC: 4.34 MIL/uL (ref 3.87–5.11)
RDW: 14.7 % (ref 11.5–15.5)
WBC: 7.2 10*3/uL (ref 4.0–10.5)

## 2014-11-25 NOTE — Telephone Encounter (Signed)
DONE

## 2014-11-25 NOTE — Progress Notes (Signed)
Subjective:   HPI  Brenda [name] is a 64 y.o. female who presents for a complete physical.  Chief Complaint  Patient presents with  . Annual Exam   Concerns: Compliant with BP medication without c/o, but BPs running about the same as here  compliant with cholesterol medication without c/o  compliant with depression and anxiety medications, doing fine, no c/o.  Still has ongoing right knee pain, worse with prolonged walking, and this does limit exercise at times.  Needs refills to [facility]   See gyn for routine pap and mammogram, last bone density 2009  Refuses colonoscopy  Reviewed their medical, surgical, family, social, medication, and allergy history and updated chart as appropriate.  Past Medical History  Diagnosis Date  . Depression   . Vitamin D deficiency   . Osteoporosis     OSTEOPENIA  . Diabetes mellitus   . Hyperlipidemia   . Hypertension   . Former smoker   . Routine gynecological examination     Dr. Gaynell Face  . Wears glasses   . Wears dentures     Past Surgical History  Procedure Laterality Date  . Cesarean section  04/12/1983  . Colonoscopy  2006    declines further colonoscopy    Social History   Social History  . Marital Status: Widowed    Spouse Name: N/A  . Number of Children: N/A  . Years of Education: N/A   Occupational History  . Not on file.   Social History Main Topics  . Smoking status: Former Games developer  . Smokeless tobacco: Never Used  . Alcohol Use: No  . Drug Use: No  . Sexual Activity: Not on file   Other Topics Concern  . 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  Lives with her daughter.   Former smoker,  Uses non nicotine vape now.      Family History  Problem Relation Age of Onset  . Heart disease Father     valvuloplasty  . Hypertension Mother   . Heart disease Mother     died at 50 with angioplasty complication   . Hypertension Sister   . Hypertension Sister   . Hypertension Sister   . Other Sister     complications of HIV     Current outpatient prescriptions:  .  ALPRAZolam (XANAX) 0.25 MG tablet, take 1-2 tablets by mouth if needed for anxiety, Disp: 30 tablet, Rfl: 1 .  aspirin 325 MG EC tablet, Take 325 mg by mouth daily.  , Disp: , Rfl:  .  atorvastatin (LIPITOR) 80 MG tablet, Take 1 tablet (80 mg total) by mouth daily at 6 PM., Disp: 90 tablet, Rfl: 3 .  buPROPion (WELLBUTRIN XL) 150 MG 24 hr tablet, Take 1 tablet (150 mg total) by mouth daily., Disp: 90 tablet, Rfl: 1 .  co-enzyme Q-10 50 MG capsule, Take 50 mg by mouth daily., Disp: , Rfl:  .  diltiazem (CARDIZEM CD) 300 MG 24 hr capsule, Take 1 capsule (300 mg total) by mouth daily., Disp: 90 capsule, Rfl: 1 .  ergocalciferol (VITAMIN D2) 50000 UNITS capsule, Take 1 capsule (50,000 Units total) by mouth once a week., Disp: 4 capsule, Rfl: 5 .  fish oil-omega-3 fatty acids 1000 MG capsule, Take 1 g by mouth daily.  , Disp: , Rfl:  .  losartan-hydrochlorothiazide (HYZAAR) 100-25 MG per tablet, Take 1 tablet  by mouth daily., Disp: 90 tablet, Rfl: 1 .  sertraline (ZOLOFT) 100 MG tablet, Take 1 tablet (100 mg total) by mouth at bedtime., Disp: 90 tablet, Rfl: 1 .  vitamin E 200 UNIT capsule, Take 200 Units by mouth daily., Disp: , Rfl:   Allergies  Allergen Reactions  . Metformin And Related     Nausea, anorexia  . Nuprin [Ibuprofen] Hives    Can tolerate generic Ibuprofen    Review of Systems Constitutional: -fever, -chills, -sweats, -unexpected weight change, -decreased appetite, -fatigue Allergy: -sneezing, -itching, -congestion Dermatology: -changing moles, --rash, -lumps ENT: -runny nose, -ear pain, -sore throat, -hoarseness, -sinus pain, -teeth pain, - ringing in ears, -hearing loss, -nosebleeds Cardiology: -chest pain, -palpitations, -swelling, -difficulty breathing when lying flat, -waking up short of breath Respiratory:  -cough, -shortness of breath, -difficulty breathing with exercise or exertion, -wheezing, -coughing up blood Gastroenterology: -abdominal pain, -nausea, -vomiting, -diarrhea, -constipation, -blood in stool, -changes in bowel movement, -difficulty swallowing or eating Hematology: -bleeding, -bruising  Musculoskeletal: +right knee pains, otherwise -joint aches, -muscle aches, -joint swelling, -back pain, -neck pain, -cramping, -changes in gait Ophthalmology: denies vision changes, eye redness, itching, discharge Urology: -burning with urination, -difficulty urinating, -blood in urine, -urinary frequency, -urgency, -incontinence Neurology: -headache, -weakness, -tingling, -numbness, -memory loss, -falls, -dizziness Psychology: -depressed mood, -agitation, -sleep problems     Objective:   Physical Exam BP 142/90 mmHg  Pulse 87  Resp 12  Ht 5\' 7"  (1.702 m)  Wt 189 lb (85.73 kg)  BMI 29.59 kg/m2  SpO2 98%  Wt Readings from Last 3 Encounters:  11/25/14 189 lb (85.73 kg)  05/29/14 193 lb (87.544 kg)  01/15/14 188 lb (85.276 kg)   BP Readings from Last 3 Encounters:  11/25/14 142/90  05/29/14 130/70  01/15/14 150/82   General appearance: alert, no distress, WD/WN, AA female Skin:right cheek faint boomerang shaped scar, few scattered macules, no worrisome lesions HEENT: normocephalic, conjunctiva/corneas normal, sclerae anicteric, PERRLA, EOMi, nares patent, no discharge or erythema, pharynx normal Oral cavity: MMM, tongue normal, teeth - dentures present Neck: supple, no lymphadenopathy, no thyromegaly, no masses, normal ROM, no bruits Chest: non tender, normal shape and expansion Heart: RRR, normal S1, S2, no murmurs Lungs: CTA bilaterally, no wheezes, rhonchi, or rales Abdomen: +bs, soft, non tender, non distended, no masses, no hepatomegaly, no splenomegaly, no bruits Back: non tender, normal ROM, no scoliosis Musculoskeletal: upper extremities non tender, no obvious deformity,  normal ROM throughout +mildly tender over right knee joint line with palpation, mild pain with Mcmurray but no pop, otherwise lower extremities non tender, no obvious deformity, normal ROM throughout Extremities: no edema, no cyanosis, no clubbing Pulses: 2+ symmetric, upper and lower extremities, normal cap refill Neurological: alert, oriented x 3, CN2-12 intact, strength normal upper extremities and lower extremities, sensation normal throughout, DTRs 2+ throughout, no cerebellar signs, gait normal Psychiatric: normal affect, behavior normal, pleasant  Breast/gyn/rectal - deferred to gyn   Adult ECG Report  Indication: physical, risk factors  Rate: 73 bpm  Rhythm: normal sinus rhythm  QRS Axis: 0 degrees  PR Interval:  QR Duration: 5 s  QTc10m < ASUREM57mME UREMEN58mMEASUREMEN43mME UREMEN86mMEASUREMEN54mMEASUREMEN<MEASUREMENAndrena Mewstion Disturbances: Q in III  Other Abnormalities: none  Patient's cardiac risk factors are: diabetes mellitus, dyslipidemia, hypertension and sedentary lifestyle.  EKG comparison: no change from 2014 EKG  Narrative Interpretation: no acute change, possible prior septal infarct   Assessment and Plan :    Encounter Diagnoses  Name Primary?  . Encounter for health maintenance examination in  adult Yes  . Special screening for malignant neoplasms, colon   . Essential hypertension   . Controlled type 2 diabetes mellitus without complication, unspecified long term insulin use status (HCC)   . Osteopenia   . Vitamin D deficiency   . Dyslipidemia   . Depression, major, in remission (HCC)   . Generalized anxiety disorder   . Non-nicotine vapor product user   . Former smoker   . Right knee pain    Physical exam - discussed healthy lifestyle, diet, exercise, preventative care, vaccinations, and addressed their concerns.  Handout given. Referral for bone density screen Referral for cologuard testing since she declines colonoscopy, low risk advised she stop vaping as well.  Glad she stopped smoking 4 years ago. Discussed need  for regular exercise, healthy diet. Diabetes type 2 - will call back with labs and recommendations HTN - will need adjustment of her medications pending labs dyslipidemia - we will call with recommendations pending labs Depression in remission and anxiety - c/t current medications Right knee pain - will consider xray going forward Follow-up pending labs

## 2014-11-25 NOTE — Telephone Encounter (Signed)
Please set up for bone density  Please refer for cologuard

## 2014-11-26 LAB — MICROALBUMIN / CREATININE URINE RATIO
Creatinine, Urine: 99.5 mg/dL
Microalb Creat Ratio: 5 mg/g (ref 0.0–30.0)
Microalb, Ur: 0.5 mg/dL (ref <2.0)

## 2014-11-26 LAB — VITAMIN D 25 HYDROXY (VIT D DEFICIENCY, FRACTURES): Vit D, 25-Hydroxy: 50 ng/mL (ref 30–100)

## 2014-11-26 LAB — HEMOGLOBIN A1C
Hgb A1c MFr Bld: 8.6 % — ABNORMAL HIGH (ref <5.7)
Mean Plasma Glucose: 200 mg/dL — ABNORMAL HIGH (ref <117)

## 2014-11-27 ENCOUNTER — Other Ambulatory Visit: Payer: Self-pay | Admitting: Medical

## 2014-11-27 MED ORDER — DILTIAZEM HCL ER COATED BEADS 300 MG PO CP24: 300.0000 mg | ORAL_CAPSULE | Freq: Every day | ORAL | Status: DC

## 2014-11-27 MED ORDER — ERGOCALCIFEROL 1.25 MG (50000 UT) PO CAPS: 50000.0000 [IU] | ORAL_CAPSULE | ORAL | Status: DC

## 2014-11-27 MED ORDER — SERTRALINE HCL 100 MG PO TABS: 100.0000 mg | ORAL_TABLET | Freq: Every day | ORAL | Status: DC

## 2014-11-27 MED ORDER — ALPRAZOLAM 0.25 MG PO TABS: 0.2500 mg | ORAL_TABLET | Freq: Every evening | ORAL | Status: DC | PRN

## 2014-11-27 MED ORDER — ATENOLOL 25 MG PO TABS: 25.0000 mg | ORAL_TABLET | Freq: Every day | ORAL | Status: DC

## 2014-11-27 MED ORDER — LOSARTAN POTASSIUM-HCTZ 100-25 MG PO TABS: 1.0000 | ORAL_TABLET | Freq: Every day | ORAL | Status: DC

## 2014-11-27 MED ORDER — BUPROPION HCL ER (XL) 150 MG PO TB24: 150.0000 mg | ORAL_TABLET | Freq: Every day | ORAL | Status: DC

## 2014-11-27 MED ORDER — ATORVASTATIN CALCIUM 80 MG PO TABS: 80.0000 mg | ORAL_TABLET | Freq: Every day | ORAL | Status: DC

## 2014-11-27 MED ORDER — CO-ENZYME Q-10 50 MG PO CAPS
50.0000 mg | ORAL_CAPSULE | Freq: Every day | ORAL | Status: DC
Start: 2014-11-27 — End: 2016-10-31

## 2014-11-28 NOTE — Addendum Note (Signed)
Addended by: Jac CanavanYSINGER, DAVID S on: 11/28/2014 06:00 AM   Modules accepted: Orders

## 2014-11-30 NOTE — Addendum Note (Signed)
Addended by: Kieth BrightlyLAWSON, Yatzary Merriweather M on: 11/30/2014 12:19 PM   Modules accepted: Kipp BroodSmartSet

## 2014-12-01 ENCOUNTER — Other Ambulatory Visit: Payer: Self-pay | Admitting: Medical

## 2014-12-01 MED ORDER — CANAGLIFLOZIN 100 MG PO TABS: 100.0000 mg | ORAL_TABLET | Freq: Every day | ORAL | Status: DC

## 2014-12-09 ENCOUNTER — Telehealth: Payer: Self-pay | Admitting: Medical

## 2014-12-09 NOTE — Telephone Encounter (Signed)
See and SCAN letter from patient.   We should be expecting a prior authorization for Invokana.  She has failed Januvia and Metformin.

## 2014-12-14 NOTE — Telephone Encounter (Signed)
PA:  Invokana

## 2014-12-16 ENCOUNTER — Telehealth: Payer: Self-pay | Admitting: Medical

## 2014-12-16 NOTE — Telephone Encounter (Signed)
[facility]

## 2014-12-21 NOTE — Telephone Encounter (Signed)
P.A. INVOKANA approved, Left message

## 2014-12-22 ENCOUNTER — Encounter: Payer: Self-pay | Admitting: Medical

## 2014-12-23 ENCOUNTER — Other Ambulatory Visit: Payer: Self-pay | Admitting: Medical

## 2014-12-23 MED ORDER — CANAGLIFLOZIN 100 MG PO TABS: 100.0000 mg | ORAL_TABLET | Freq: Every day | ORAL | Status: DC

## 2015-01-12 ENCOUNTER — Other Ambulatory Visit (INDEPENDENT_AMBULATORY_CARE_PROVIDER_SITE_OTHER): Payer: Commercial Managed Care - HMO

## 2015-01-12 ENCOUNTER — Ambulatory Visit
Admission: RE | Admit: 2015-01-12 | Discharge: 2015-01-12 | Disposition: A | Discharge status: Home / Self Care | Payer: Commercial Managed Care - HMO | Source: Ambulatory Visit | Attending: Medical | Admitting: Medical

## 2015-01-12 DIAGNOSIS — Z1211 Encounter for screening for malignant neoplasm of colon: Secondary | ICD-10-CM

## 2015-01-12 DIAGNOSIS — M8580 Other specified disorders of bone density and structure, unspecified site: Secondary | ICD-10-CM

## 2015-01-12 DIAGNOSIS — Z0000 Encounter for general adult medical examination without abnormal findings: Secondary | ICD-10-CM

## 2015-01-12 DIAGNOSIS — E559 Vitamin D deficiency, unspecified: Secondary | ICD-10-CM

## 2015-01-12 LAB — HEMOCCULT GUIAC POC 1CARD (OFFICE)
FECAL OCCULT BLD: POSITIVE
FECAL OCCULT BLD: POSITIVE
Fecal Occult Blood, POC: POSITIVE — AB

## 2015-01-13 ENCOUNTER — Encounter: Payer: Self-pay | Admitting: Medical

## 2015-01-22 ENCOUNTER — Other Ambulatory Visit (INDEPENDENT_AMBULATORY_CARE_PROVIDER_SITE_OTHER): Payer: Commercial Managed Care - HMO

## 2015-01-22 DIAGNOSIS — Z1211 Encounter for screening for malignant neoplasm of colon: Secondary | ICD-10-CM

## 2015-01-22 LAB — HEMOCCULT GUIAC POC 1CARD (OFFICE)
Card #3 Fecal Occult Blood, POC: NEGATIVE
FECAL OCCULT BLD: NEGATIVE
FECAL OCCULT BLD: NEGATIVE

## 2015-06-04 ENCOUNTER — Encounter: Payer: Self-pay | Admitting: Medical

## 2015-06-04 ENCOUNTER — Ambulatory Visit (INDEPENDENT_AMBULATORY_CARE_PROVIDER_SITE_OTHER): Payer: Commercial Managed Care - HMO | Admitting: Medical

## 2015-06-04 VITALS — BP 140/80 | HR 80 | Resp 14 | Ht 67.0 in | Wt 188.0 lb

## 2015-06-04 DIAGNOSIS — I10 Essential (primary) hypertension: Secondary | ICD-10-CM | POA: Diagnosis not present

## 2015-06-04 DIAGNOSIS — M8580 Other specified disorders of bone density and structure, unspecified site: Secondary | ICD-10-CM

## 2015-06-04 DIAGNOSIS — E785 Hyperlipidemia, unspecified: Secondary | ICD-10-CM

## 2015-06-04 DIAGNOSIS — E118 Type 2 diabetes mellitus with unspecified complications: Secondary | ICD-10-CM

## 2015-06-04 DIAGNOSIS — E559 Vitamin D deficiency, unspecified: Secondary | ICD-10-CM | POA: Diagnosis not present

## 2015-06-04 MED ORDER — ATENOLOL 50 MG PO TABS
50.0000 mg | ORAL_TABLET | Freq: Every day | ORAL | Status: DC
Start: 2015-06-04 — End: 2015-10-04

## 2015-06-04 MED ORDER — SAXAGLIPTIN-METFORMIN ER 5-500 MG PO TB24: 1.0000 | ORAL_TABLET | Freq: Every day | ORAL | Status: DC

## 2015-06-04 NOTE — Progress Notes (Signed)
  Subjective:    Patient ID: Brenda [name], female    DOB: 1950-12-05, 65 y.o.   MRN: [id number]  Brenda [name] is a 65 y.o. female who presents for follow-up of Type 2 diabetes mellitus, HTN, hyperlipidemia.  Patient is checking home blood sugars.   Home blood sugar records: high 177 low 126 How often is blood sugars being checked: qd Current symptoms/problems none Daily foot checks: yes  Any foot concerns: none Last eye exam: 12/16 Exercise: get it in when she can  BPs usually under 140 SBP, DBP usually 80s, but sometimes SBP 160.  Complaint with her other medications without c/o  She does note diet hasn't been great of late as she has been travelling, went to the [place], and ate a lot of different foods.  The following portions of the patient's history were reviewed and updated as appropriate: allergies, current medications, past medical history, past social history and problem list.  ROS as in subjective above.     Objective:     BP 140/80 mmHg  Pulse 80  Resp 14  Ht 5\' 7"  (1.702 m)  Wt 188 lb (85.276 kg)  BMI 29.44 kg/m2  General appearance: alert, no distress, WD/WN Neck: supple, no lymphadenopathy, no thyromegaly, no masses Heart: RRR, normal S1, S2, no murmurs Lungs: CTA bilaterally, no wheezes, rhonchi, or rales Ext: no edema Pulses: 2+ symmetric, upper and lower extremities, normal cap refill    Assessment & Plan:  / Encounter Diagnoses  Name Primary?  . Diabetes mellitus with complication (HCC) Yes  . Essential hypertension   . Dyslipidemia   . Vitamin D deficiency   . Osteopenia     HgbA1C not at goal.  Reviewed labs from last visit.  No other labs today.  Hypertension - c/t Losartan HCT 100/25mg  daily, c/t Cardizem 300mg  daily, add Atenolol 25mg  daily  Hyperlipidemia - c/t Lipitor 80mg  daily, Aspirin daily  Diabetes- stop plain invokana.  Use trial of Kombiglyze 5/500mg  XR daily for 20 days, then trial of Invokamet 50/500mg   XR daily for 14 days.   She will let me know how she is tolerating these, she will monitor glucose, needs to eat healthier, c/t exercise, c/t daily foot checks.   Call report 3-4 wk, plan f/u in 31mo.  C/t OTC Vit D  C/t all other medications as usual  Brenda [name] was seen today for diabetes.  Diagnoses and all orders for this visit:  Diabetes mellitus with complication (HCC) -     POCT glycosylated hemoglobin (Hb A1C)  Essential hypertension -     atenolol (TENORMIN) 50 MG tablet; Take 1 tablet (50 mg total) by mouth daily.  Dyslipidemia  Vitamin D deficiency  Osteopenia  Other orders -     Saxagliptin-Metformin (KOMBIGLYZE XR) 5-500 MG TB24; Take 1 tablet by mouth daily.

## 2015-06-07 LAB — POCT GLYCOSYLATED HEMOGLOBIN (HGB A1C): Hemoglobin A1C: 9.3

## 2015-07-06 ENCOUNTER — Telehealth: Payer: Self-pay | Admitting: Medical

## 2015-07-06 ENCOUNTER — Other Ambulatory Visit: Payer: Self-pay | Admitting: Medical

## 2015-07-06 MED ORDER — SAXAGLIPTIN-METFORMIN ER 5-500 MG PO TB24: 1.0000 | ORAL_TABLET | Freq: Every day | ORAL | Status: DC

## 2015-07-06 NOTE — Telephone Encounter (Signed)
Pt is aware and will call back for appt. 

## 2015-07-06 NOTE — Telephone Encounter (Signed)
I reviewed the readings she sent in which were pretty good for BP and sugar.   Lets go [employer] Kombigylze as it sounds like she is tolerating this well. C/t the BP medication changes.  F/u in 98mo, sooner if not seeing good sugar numbers like she brought in.

## 2015-07-09 ENCOUNTER — Telehealth: Payer: Self-pay

## 2015-07-09 MED ORDER — SAXAGLIPTIN-METFORMIN ER 5-500 MG PO TB24: 1.0000 | ORAL_TABLET | Freq: Every day | ORAL | Status: DC

## 2015-07-09 NOTE — Telephone Encounter (Signed)
Pt wanted sent to [facility] because she has coupon

## 2015-07-09 NOTE — Telephone Encounter (Signed)
Pt called and needed us to call [facility] and let them know we did not mean to send the RX to them so she could return it for a refund. I called and spoke with representative and they stated that should be fine

## 2015-10-04 ENCOUNTER — Telehealth: Payer: Self-pay | Admitting: Medical

## 2015-10-04 DIAGNOSIS — I10 Essential (primary) hypertension: Secondary | ICD-10-CM

## 2015-10-04 MED ORDER — ATENOLOL 50 MG PO TABS: 50.0000 mg | ORAL_TABLET | Freq: Every day | ORAL | 0 refills | Status: DC

## 2015-10-04 NOTE — Telephone Encounter (Signed)
Sent med to pharmacy  

## 2015-10-04 NOTE — Telephone Encounter (Signed)
ALERT PT HAS APPT. IN SEPT. PT needs ref[facility] [address]

## 2015-10-04 NOTE — Telephone Encounter (Signed)
Pt called and stated that she was informed by pharmacy that a prior auth would be required for her Kombiglyze XR. Pt was informed that Vernona RiegerLaura handles that and she is out and would be back on Wednesday and she said she had plenty and could wait until then. Pt has new ins thru [address] and [facility] [address]

## 2015-10-20 ENCOUNTER — Other Ambulatory Visit: Payer: Self-pay | Admitting: Medical

## 2015-10-20 DIAGNOSIS — Z1231 Encounter for screening mammogram for malignant neoplasm of breast: Secondary | ICD-10-CM

## 2015-10-22 ENCOUNTER — Ambulatory Visit: Payer: Commercial Managed Care - HMO | Admitting: Medical

## 2015-11-01 ENCOUNTER — Other Ambulatory Visit: Payer: Self-pay | Admitting: Medical

## 2015-11-02 NOTE — Telephone Encounter (Signed)
Pt has an appt Friday. Refilling med

## 2015-11-03 ENCOUNTER — Telehealth: Payer: Self-pay | Admitting: Medical

## 2015-11-03 NOTE — Telephone Encounter (Signed)
Pt called and states that she needs a prior authorization on her komibigyze states she is completey out of her medicine. States she called the other day but not I did not see any notes on this, she would like a call back pt can be reached at [phone] and pt uses [id number] - [place], [place] - [address]

## 2015-11-04 NOTE — Telephone Encounter (Signed)
[facility] CallasP.A. KOMBIGLYZE approved til 11/02/16, called pharmacy & went thru, Called pt & informed

## 2015-11-05 ENCOUNTER — Encounter: Payer: Self-pay | Admitting: Medical

## 2015-11-05 ENCOUNTER — Ambulatory Visit (INDEPENDENT_AMBULATORY_CARE_PROVIDER_SITE_OTHER): Payer: Managed Care, Other (non HMO) | Admitting: Medical

## 2015-11-05 VITALS — BP 140/92 | HR 58 | Ht 67.0 in | Wt 182.0 lb

## 2015-11-05 DIAGNOSIS — E785 Hyperlipidemia, unspecified: Secondary | ICD-10-CM | POA: Diagnosis not present

## 2015-11-05 DIAGNOSIS — I10 Essential (primary) hypertension: Secondary | ICD-10-CM | POA: Diagnosis not present

## 2015-11-05 DIAGNOSIS — F325 Major depressive disorder, single episode, in full remission: Secondary | ICD-10-CM

## 2015-11-05 DIAGNOSIS — E118 Type 2 diabetes mellitus with unspecified complications: Secondary | ICD-10-CM | POA: Diagnosis not present

## 2015-11-05 DIAGNOSIS — E559 Vitamin D deficiency, unspecified: Secondary | ICD-10-CM

## 2015-11-05 DIAGNOSIS — Z7189 Other specified counseling: Secondary | ICD-10-CM | POA: Insufficient documentation

## 2015-11-05 DIAGNOSIS — Z7185 Encounter for immunization safety counseling: Secondary | ICD-10-CM

## 2015-11-05 LAB — CBC
HCT: 38.2 % (ref 35.0–45.0)
Hemoglobin: 12.7 g/dL (ref 11.7–15.5)
MCH: 29 pg (ref 27.0–33.0)
MCHC: 33.2 g/dL (ref 32.0–36.0)
MCV: 87.2 fL (ref 80.0–100.0)
MPV: 10.7 fL (ref 7.5–12.5)
Platelets: 281 10*3/uL (ref 140–400)
RBC: 4.38 MIL/uL (ref 3.80–5.10)
RDW: 14.8 % (ref 11.0–15.0)
WBC: 9 10*3/uL (ref 4.0–10.5)

## 2015-11-05 MED ORDER — ATENOLOL 50 MG PO TABS: 50.0000 mg | ORAL_TABLET | Freq: Every day | ORAL | 2 refills | Status: DC

## 2015-11-05 MED ORDER — ATORVASTATIN CALCIUM 80 MG PO TABS: 80.0000 mg | ORAL_TABLET | Freq: Every day | ORAL | 2 refills | Status: DC

## 2015-11-05 MED ORDER — ERGOCALCIFEROL 1.25 MG (50000 UT) PO CAPS: 50000.0000 [IU] | ORAL_CAPSULE | ORAL | 3 refills | Status: DC

## 2015-11-05 MED ORDER — BUPROPION HCL ER (XL) 150 MG PO TB24: 150.0000 mg | ORAL_TABLET | Freq: Every day | ORAL | 2 refills | Status: DC

## 2015-11-05 MED ORDER — SERTRALINE HCL 100 MG PO TABS: ORAL_TABLET | ORAL | 2 refills | Status: DC

## 2015-11-05 MED ORDER — LOSARTAN POTASSIUM-HCTZ 100-25 MG PO TABS: 1.0000 | ORAL_TABLET | Freq: Every day | ORAL | 2 refills | Status: DC

## 2015-11-05 MED ORDER — DILTIAZEM HCL ER COATED BEADS 300 MG PO CP24: 300.0000 mg | ORAL_CAPSULE | Freq: Every day | ORAL | 2 refills | Status: DC

## 2015-11-05 NOTE — Progress Notes (Signed)
Subjective:    Patient ID: Brenda [name], female    DOB: 1950/07/21, 65 y.o.   MRN: [id number]  Brenda [name] is a 65 y.o. female who presents for follow-up of Type 2 diabetes mellitus, HTN, hyperlipidemia.  At last visit we changed to [facility] for diabetes and added Atenolol for BP.  She notes that she has been working hard in recent months on diet, exercise, to keep things under control.   She feels like her numbers will be much improved today.     Patient is checking home blood sugars.   Home blood sugar records: high 140 low in the 70s How often is blood sugars being checked: qd and some post prandial Current symptoms/problems none Daily foot checks: yes  Any foot concerns: none  BPs usually under 120 SBP, DBP usually 80s.  Complaint with her other medications without c/o  The following portions of the patient's history were reviewed and updated as appropriate: allergies, current medications, past medical history, past social history and problem list.  ROS as in subjective above.     Objective:     BP (!) 140/92 (BP Location: Right Arm, Patient Position: Sitting, Cuff Size: Normal)   Pulse (!) 58   Ht 5\' 7"  (1.702 m)   Wt 182 lb (82.6 kg)   SpO2 99%   BMI 28.51 kg/m   General appearance: alert, no distress, WD/WN Neck: supple, no lymphadenopathy, no thyromegaly, no masses Heart: RRR, normal S1, S2, no murmurs Lungs: CTA bilaterally, no wheezes, rhonchi, or rales Ext: no edema Pulses: 2+ symmetric, upper and lower extremities, normal cap refill    Assessment & Plan:  / Encounter Diagnoses  Name Primary?  . Diabetes mellitus with complication (HCC) Yes  . Essential hypertension   . Vitamin D deficiency   . Dyslipidemia   . Depression, major, in remission (HCC)   . Vaccine counseling     Diabetes- c/t Kombiglyze 5/500mg  XR daily, c/t exercise, c/t daily foot checks.   Labs today  Hypertension - c/t Losartan HCT 100/25mg  daily, c/t Cardizem  300mg  daily, c/t Atenolol 25mg  daily. Of note, home readings are normal!  Hyperlipidemia - c/t Lipitor 80mg  daily, Aspirin daily  C/t OTC Vit D  C/t all other medications as usual  She is getting her flu shot at work  She has established with [facility] since Dr. Gaynell Face retired.  Brenda [name] was seen today for follow-up.  Diagnoses and all orders for this visit:  Diabetes mellitus with complication (HCC) -     Comprehensive metabolic panel -     CBC -     Lipid panel -     Hemoglobin A1c  Essential hypertension -     atenolol (TENORMIN) 50 MG tablet; Take 1 tablet (50 mg total) by mouth daily.  Vitamin D deficiency  Dyslipidemia  Depression, major, in remission (HCC)  Vaccine counseling  Other orders -     sertraline (ZOLOFT) 100 MG tablet; Pt takes 50 mg daily -     losartan-hydrochlorothiazide (HYZAAR) 100-25 MG tablet; Take 1 tablet by mouth daily. -     ergocalciferol (VITAMIN D2) 50000 units capsule; Take 1 capsule (50,000 Units total) by mouth once a week. -     diltiazem (CARDIZEM CD) 300 MG 24 hr capsule; Take 1 capsule (300 mg total) by mouth daily. -     buPROPion (WELLBUTRIN XL) 150 MG 24 hr tablet; Take 1 tablet (150 mg total) by mouth daily. -  atorvastatin (LIPITOR) 80 MG tablet; Take 1 tablet (80 mg total) by mouth daily at 6 PM.

## 2015-11-06 LAB — COMPREHENSIVE METABOLIC PANEL
ALT: 12 U/L (ref 6–29)
AST: 18 U/L (ref 10–35)
Albumin: 4.2 g/dL (ref 3.6–5.1)
Alkaline Phosphatase: 67 U/L (ref 33–130)
BILIRUBIN TOTAL: 0.3 mg/dL (ref 0.2–1.2)
BUN: 15 mg/dL (ref 7–25)
CO2: 24 mmol/L (ref 20–31)
Calcium: 9.2 mg/dL (ref 8.6–10.4)
Chloride: 103 mmol/L (ref 98–110)
Creat: 1.18 mg/dL — ABNORMAL HIGH (ref 0.50–0.99)
GLUCOSE: 98 mg/dL (ref 65–99)
Potassium: 4 mmol/L (ref 3.5–5.3)
SODIUM: 138 mmol/L (ref 135–146)
Total Protein: 6.9 g/dL (ref 6.1–8.1)

## 2015-11-06 LAB — LIPID PANEL
CHOLESTEROL: 159 mg/dL (ref 125–200)
HDL: 50 mg/dL (ref >=46)
LDL Cholesterol: 89 mg/dL (ref <130)
Total CHOL/HDL Ratio: 3.2 Ratio (ref <=5.0)
Triglycerides: 101 mg/dL (ref <150)
VLDL: 20 mg/dL (ref <30)

## 2015-11-06 LAB — HEMOGLOBIN A1C
Hgb A1c MFr Bld: 6.4 % — ABNORMAL HIGH (ref <5.7)
MEAN PLASMA GLUCOSE: 137 mg/dL

## 2015-11-07 ENCOUNTER — Other Ambulatory Visit: Payer: Self-pay | Admitting: Medical

## 2015-11-07 MED ORDER — SAXAGLIPTIN-METFORMIN ER 5-500 MG PO TB24: 1.0000 | ORAL_TABLET | Freq: Every day | ORAL | 3 refills | Status: DC

## 2015-11-08 ENCOUNTER — Encounter: Payer: Self-pay | Admitting: *Deleted

## 2015-11-19 ENCOUNTER — Ambulatory Visit
Admission: RE | Admit: 2015-11-19 | Discharge: 2015-11-19 | Disposition: A | Discharge status: Home / Self Care | Payer: Managed Care, Other (non HMO) | Source: Ambulatory Visit | Attending: Medical | Admitting: Medical

## 2015-11-19 DIAGNOSIS — Z1231 Encounter for screening mammogram for malignant neoplasm of breast: Secondary | ICD-10-CM

## 2015-11-23 ENCOUNTER — Encounter: Payer: Self-pay | Admitting: Obstetrics

## 2015-11-23 ENCOUNTER — Ambulatory Visit (INDEPENDENT_AMBULATORY_CARE_PROVIDER_SITE_OTHER): Payer: Managed Care, Other (non HMO) | Admitting: Obstetrics

## 2015-11-23 VITALS — BP 148/85 | HR 73 | Temp 97.2°F | Ht 66.0 in | Wt 185.8 lb

## 2015-11-23 DIAGNOSIS — Z780 Asymptomatic menopausal state: Secondary | ICD-10-CM | POA: Diagnosis not present

## 2015-11-23 DIAGNOSIS — Z01419 Encounter for gynecological examination (general) (routine) without abnormal findings: Secondary | ICD-10-CM | POA: Diagnosis not present

## 2015-11-23 DIAGNOSIS — Z124 Encounter for screening for malignant neoplasm of cervix: Secondary | ICD-10-CM

## 2015-11-23 NOTE — Progress Notes (Signed)
Subjective:        Brenda [name] is a 65 y.o. female here for a routine exam.  Current complaints: None.    Personal health questionnaire:  Is patient Ashkenazi Jewish, have a family history of breast and/or ovarian cancer: no Is there a family history of uterine cancer diagnosed at age < 26, gastrointestinal cancer, urinary tract cancer, family member who is a Personnel officer syndrome-associated carrier: no Is the patient overweight and hypertensive, family history of diabetes, personal history of gestational diabetes, preeclampsia or PCOS: no Is patient over 71, have PCOS,  family history of premature CHD under age 16, diabetes, smoke, have hypertension or peripheral artery disease:  no At any time, has a partner hit, kicked or otherwise hurt or frightened you?: no Over the past 2 weeks, have you felt down, depressed or hopeless?: no Over the past 2 weeks, have you felt little interest or pleasure in doing things?:no   Gynecologic History No LMP recorded (lmp unknown). Patient is postmenopausal. Contraception: post menopausal status Last Pap: 2016. Results were: normal Last mammogram: 2017. Results were: normal  Obstetric History OB History  No data available    Past Medical History:  Diagnosis Date  . Depression   . Diabetes mellitus   . Former smoker   . Hyperlipidemia   . Hypertension   . Osteoporosis    OSTEOPENIA  . Routine gynecological examination    Dr. Gaynell Face  . Vitamin D deficiency   . Wears dentures   . Wears glasses     Past Surgical History:  Procedure Laterality Date  . CESAREAN SECTION  04/12/1983  . COLONOSCOPY  2006   declines further colonoscopy     Current Outpatient Prescriptions:  .  ALPRAZolam (XANAX) 0.25 MG tablet, Take 1 tablet (0.25 mg total) by mouth at bedtime as needed for anxiety., Disp: 90 tablet, Rfl: 0 .  aspirin 325 MG EC tablet, Take 325 mg by mouth daily.  , Disp: , Rfl:  .  atenolol (TENORMIN) 50 MG tablet, Take 1 tablet (50  mg total) by mouth daily., Disp: 30 tablet, Rfl: 2 .  atorvastatin (LIPITOR) 80 MG tablet, Take 1 tablet (80 mg total) by mouth daily at 6 PM., Disp: 30 tablet, Rfl: 2 .  buPROPion (WELLBUTRIN XL) 150 MG 24 hr tablet, Take 1 tablet (150 mg total) by mouth daily., Disp: 30 tablet, Rfl: 2 .  co-enzyme Q-10 50 MG capsule, Take 1 capsule (50 mg total) by mouth daily., Disp: 90 capsule, Rfl: 3 .  diltiazem (CARDIZEM CD) 300 MG 24 hr capsule, Take 1 capsule (300 mg total) by mouth daily., Disp: 30 capsule, Rfl: 2 .  ergocalciferol (VITAMIN D2) 50000 units capsule, Take 1 capsule (50,000 Units total) by mouth once a week., Disp: 12 capsule, Rfl: 3 .  fish oil-omega-3 fatty acids 1000 MG capsule, Take 1 g by mouth daily.  , Disp: , Rfl:  .  losartan-hydrochlorothiazide (HYZAAR) 100-25 MG tablet, Take 1 tablet by mouth daily., Disp: 30 tablet, Rfl: 2 .  Saxagliptin-Metformin (KOMBIGLYZE XR) 5-500 MG TB24, Take 1 tablet by mouth daily., Disp: 90 tablet, Rfl: 3 .  sertraline (ZOLOFT) 100 MG tablet, Pt takes 50 mg daily, Disp: 30 tablet, Rfl: 2 .  vitamin E 200 UNIT capsule, Take 200 Units by mouth daily., Disp: , Rfl:  Allergies  Allergen Reactions  . Metformin And Related     Nausea, anorexia  . Nuprin [Ibuprofen] Hives    Can tolerate generic Ibuprofen  Social History  Substance Use Topics  . Smoking status: Former Games developermoker  . Smokeless tobacco: Never Used  . Alcohol use No    Family History  Problem Relation Age of Onset  . Heart disease Father     valvuloplasty  . Hypertension Mother   . Heart disease Mother     died at 6696 with angioplasty complication  . Hypertension Sister   . Hypertension Sister   . Hypertension Sister   . Other Sister     complications of HIV      Review of Systems  Constitutional: negative for fatigue and weight loss Respiratory: negative for cough and wheezing Cardiovascular: negative for chest pain, fatigue and palpitations Gastrointestinal: negative for  abdominal pain and change in bowel habits Musculoskeletal:negative for myalgias Neurological: negative for gait problems and tremors Behavioral/Psych: negative for abusive relationship, depression Endocrine: negative for temperature intolerance   Genitourinary:negative for abnormal menstrual periods, genital lesions, hot flashes, sexual problems and vaginal discharge Integument/breast: negative for breast lump, breast tenderness, nipple discharge and skin lesion(s)    Objective:       Temp 97.2 F (36.2 C)   Ht 5\' 6"  (1.676 m)   Wt 185 lb 12.8 oz (84.3 kg)   LMP  (LMP Unknown)   BMI 29.99 kg/m  General:   alert  Skin:   no rash or abnormalities  Lungs:   clear to auscultation bilaterally  Heart:   regular rate and rhythm, S1, S2 normal, no murmur, click, rub or gallop  Breasts:   normal without suspicious masses, skin or nipple changes or axillary nodes  Abdomen:  normal findings: no organomegaly, soft, non-tender and no hernia  Pelvis:  External genitalia: normal general appearance Urinary system: urethral meatus normal and bladder without fullness, nontender Vaginal: normal without tenderness, induration or masses Cervix: normal appearance Adnexa: normal bimanual exam Uterus: anteverted and non-tender, normal size   Lab Review Urine pregnancy test Labs reviewed yes Radiologic studies reviewed yes  50% of 20 min visit spent on counseling and coordination of care.   Assessment:    Healthy female exam.    Postmenopause.  Doing well.   Plan:      Education reviewed: calcium supplements, depression evaluation, low fat, low cholesterol diet, self breast exams and weight bearing exercise. Follow up in: 2 years.   No orders of the defined types were placed in this encounter.  No orders of the defined types were placed in this encounter.

## 2015-11-23 NOTE — Addendum Note (Signed)
Addended by: Coral CeoHARPER, CHARLES A on: 11/23/2015 04:08 PM   Modules accepted: Orders

## 2015-11-24 ENCOUNTER — Encounter: Payer: Self-pay | Admitting: Internal Medicine

## 2015-11-24 LAB — CYTOLOGY - PAP

## 2015-12-09 ENCOUNTER — Encounter: Payer: Self-pay | Admitting: Medical

## 2016-01-13 ENCOUNTER — Other Ambulatory Visit: Payer: Self-pay | Admitting: Medical

## 2016-01-13 MED ORDER — SITAGLIP PHOS-METFORMIN HCL ER 50-1000 MG PO TB24: 1.0000 | ORAL_TABLET | Freq: Every day | ORAL | 1 refills | Status: DC

## 2016-02-09 ENCOUNTER — Other Ambulatory Visit: Payer: Self-pay | Admitting: Medical

## 2016-03-10 ENCOUNTER — Encounter: Payer: Self-pay | Admitting: Medical

## 2016-03-10 ENCOUNTER — Ambulatory Visit (INDEPENDENT_AMBULATORY_CARE_PROVIDER_SITE_OTHER): Payer: Managed Care, Other (non HMO) | Admitting: Medical

## 2016-03-10 VITALS — BP 136/74 | HR 60 | Wt 183.0 lb

## 2016-03-10 DIAGNOSIS — M8580 Other specified disorders of bone density and structure, unspecified site: Secondary | ICD-10-CM

## 2016-03-10 DIAGNOSIS — I10 Essential (primary) hypertension: Secondary | ICD-10-CM | POA: Diagnosis not present

## 2016-03-10 DIAGNOSIS — E785 Hyperlipidemia, unspecified: Secondary | ICD-10-CM

## 2016-03-10 DIAGNOSIS — E118 Type 2 diabetes mellitus with unspecified complications: Secondary | ICD-10-CM | POA: Diagnosis not present

## 2016-03-10 NOTE — Patient Instructions (Signed)
Since you didn't tolerate Janumet, and since your insurance made you get off Kombiglyze, lets try the following:   Begin Oseni 25/30mg  daily until you run out of samples in 3 weeks  Begin Jentadueto XR 2.5/1000mg  daily once you run out of Oseni  Monitor sugars while on these medications  Let me know which one you are tolerating  Call and complain to your insurer and let them know you did well on Kombiglyze and didn't tolerate Jaumet at all

## 2016-03-10 NOTE — Progress Notes (Signed)
Subjective:    Patient ID: Brenda [name], female    DOB: Jun 19, 1950, 66 y.o.   MRN: [id number]  Brenda [name] is a 66 y.o. female who presents for follow-up of Type 2 diabetes mellitus, HTN, hyperlipidemia.  From last visit in 10/2015 no medications were changed as her BP, sugars, lipids were all under good control and she had been working hard to eat healthy and exercise.  However, in recent weeks her new insurer wouldn't cover Kombiglyze, made her change to janumet, alternate.   3 days after taking Janumet started getting worse headache of her life.   Headaches continued for the whole day.  By morning headache would be gone, but would recur when she took her next dose of Janumet.  This continued for a week.  She quit taking Janumet and the headache have resolved and not returned.  She notes no hx/o migraine but says this was as bad as what she thinks a migraine would be like.  [facility] , doing fine on her remaining supply.   She is compliant with other medications without c/o.    Since last visit took a big trip to [place] with her daughter who is a Publishing rights managernurse practitioner and her grandchildren, 67 yo and 10yo.  Had a great time, but did eat a lot of foods, was walking a lot.     Blood sugars looking good at home as well as BPs.  No other aggravating or relieving factors. No other complaint.  The following portions of the patient's history were reviewed and updated as appropriate: allergies, current medications, past medical history, past social history and problem list.  ROS as in subjective above.     Objective:     BP 136/74   Pulse 60   Wt 183 lb (83 kg)   LMP  (LMP Unknown)   SpO2 99%   BMI 29.54 kg/m   Wt Readings from Last 3 Encounters:  03/10/16 183 lb (83 kg)  11/23/15 185 lb 12.8 oz (84.3 kg)  11/05/15 182 lb (82.6 kg)   General appearance: alert, no distress, WD/WN Neck: supple, no lymphadenopathy, no thyromegaly, no  masses Heart: RRR, normal S1, S2, no murmurs Lungs: CTA bilaterally, no wheezes, rhonchi, or rales Ext: no edema Pulses: 2+ symmetric, upper and lower extremities, normal cap refill Neuro: nonfocal exam   Assessment & Plan:   Encounter Diagnoses  Name Primary?  . Diabetes mellitus with complication (HCC) Yes  . Essential hypertension   . Dyslipidemia   . Osteopenia, unspecified location     Diabetes- did fine on Kombiglyze 5/500mg  XR daily, but insurance currently declines this.  She didn't tolerate Janumet.  C/t exercise, c/t daily foot checks.   Labs today  Hypertension - c/t Losartan HCT 100/25mg  daily, c/t Cardizem 300mg  daily, c/t Atenolol 25mg  daily. Of note, home readings are normal!  Hyperlipidemia - c/t Lipitor 80mg  daily, Aspirin daily  C/t OTC Vit D  C/t all other medications as usual  She gets her flu shot at work  Patient Instructions  Since you didn't tolerate Janumet, and since your insurance made you get off Kombiglyze, lets try the following:   Begin Oseni 25/30mg  daily until you run out of samples in 3 weeks  Begin Jentadueto XR 2.5/1000mg  daily once you run out of Oseni  Monitor sugars while on these medications  Let me know which one you are tolerating  Call and complain to your insurer and  let them know you did well on Kombiglyze and didn't tolerate Jaumet at all    Margree was seen today for dm check.  Diagnoses and all orders for this visit:  Diabetes mellitus with complication (HCC) -     Basic metabolic panel -     Hemoglobin A1c  Essential hypertension -     Basic metabolic panel -     Hemoglobin A1c  Dyslipidemia  Osteopenia, unspecified location

## 2016-03-11 LAB — BASIC METABOLIC PANEL
BUN: 15 mg/dL (ref 7–25)
CHLORIDE: 105 mmol/L (ref 98–110)
CO2: 22 mmol/L (ref 20–31)
CREATININE: 1.11 mg/dL — AB (ref 0.50–0.99)
Calcium: 9.4 mg/dL (ref 8.6–10.4)
Glucose, Bld: 119 mg/dL — ABNORMAL HIGH (ref 65–99)
Potassium: 4 mmol/L (ref 3.5–5.3)
Sodium: 140 mmol/L (ref 135–146)

## 2016-03-11 LAB — HEMOGLOBIN A1C
Hgb A1c MFr Bld: 6.7 % — ABNORMAL HIGH (ref <5.7)
Mean Plasma Glucose: 146 mg/dL

## 2016-03-15 ENCOUNTER — Encounter: Payer: Self-pay | Admitting: Medical

## 2016-04-24 ENCOUNTER — Other Ambulatory Visit: Payer: Self-pay | Admitting: Medical

## 2016-04-24 DIAGNOSIS — I10 Essential (primary) hypertension: Secondary | ICD-10-CM

## 2016-05-07 ENCOUNTER — Telehealth: Payer: Self-pay | Admitting: Medical

## 2016-05-07 NOTE — Telephone Encounter (Signed)
Recv'd fax stating [facility] is formulary exclusion, preferred alternatives are Januvia, Janumet, Tradjenta, Jentadueta.  Looks like Vincenza HewsShane gave pt samples of Jentadueta to try. Will call pt & see if tolerating.

## 2016-05-30 ENCOUNTER — Other Ambulatory Visit: Payer: Self-pay | Admitting: Medical

## 2016-05-30 NOTE — Telephone Encounter (Signed)
Left message for pt

## 2016-06-26 ENCOUNTER — Encounter: Payer: Self-pay | Admitting: Medical

## 2016-06-26 ENCOUNTER — Ambulatory Visit (INDEPENDENT_AMBULATORY_CARE_PROVIDER_SITE_OTHER): Payer: 59 | Admitting: Medical

## 2016-06-26 VITALS — BP 124/82 | HR 70 | Ht 67.0 in | Wt 185.0 lb

## 2016-06-26 DIAGNOSIS — I10 Essential (primary) hypertension: Secondary | ICD-10-CM | POA: Diagnosis not present

## 2016-06-26 DIAGNOSIS — E785 Hyperlipidemia, unspecified: Secondary | ICD-10-CM

## 2016-06-26 DIAGNOSIS — E118 Type 2 diabetes mellitus with unspecified complications: Secondary | ICD-10-CM | POA: Diagnosis not present

## 2016-06-26 NOTE — Progress Notes (Signed)
Subjective:    Patient ID: Brenda [name], female    DOB: 12-24-1950, 66 y.o.   MRN: [id number]  Brenda [name] is a 66 y.o. female who presents for follow-up of Type 2 diabetes mellitus, HTN, hyperlipidemia.  Diabetes - here for diabetes.   Checking sugars, lowest 70s, highest in the 180s.   She really likes jelly beans from time to time.  She ended up calling insurer after last visit, was able to get insurer to cover kombiglyze.  No foot concerns.   She is compliant with other medications without c/o.    Compliant with BP medication, lipid medication.  No side effects.  No chest pain, swelling palpitations.    The following portions of the patient's history were reviewed and updated as appropriate: allergies, current medications, past medical history, past social history and problem list.  ROS as in subjective above.   Past Medical History:  Diagnosis Date  . Depression   . Diabetes mellitus   . Former smoker   . Hyperlipidemia   . Hypertension   . Osteoporosis    OSTEOPENIA  . Routine gynecological examination    Dr. Gaynell [name]  . Vitamin D deficiency   . Wears dentures   . Wears glasses    Current Outpatient Prescriptions on File Prior to Visit  Medication Sig Dispense Refill  . ALPRAZolam (XANAX) 0.25 MG tablet Take 1 tablet (0.25 mg total) by mouth at bedtime as needed for anxiety. 90 tablet 0  . aspirin 325 MG EC tablet Take 325 mg by mouth daily.      Marland Kitchen atenolol (TENORMIN) 50 MG tablet take 1 tablet by mouth once daily 30 tablet 2  . atorvastatin (LIPITOR) 80 MG tablet take 1 tablet by mouth once daily AT 6 PM 30 tablet 2  . buPROPion (WELLBUTRIN XL) 150 MG 24 hr tablet Take 1 tablet (150 mg total) by mouth daily. 30 tablet 2  . CARTIA XT 300 MG 24 hr capsule take 1 capsule by mouth once daily 30 capsule 3  . co-enzyme Q-10 50 MG capsule Take 1 capsule (50 mg total) by mouth daily. 90 capsule 3  . ergocalciferol (VITAMIN D2) 50000 units capsule Take 1 capsule  (50,000 Units total) by mouth once a week. 12 capsule 3  . fish oil-omega-3 fatty acids 1000 MG capsule Take 1 g by mouth daily.      Marland Kitchen losartan-hydrochlorothiazide (HYZAAR) 100-25 MG tablet take 1 tablet by mouth once daily 30 tablet 3  . sertraline (ZOLOFT) 100 MG tablet Pt takes 50 mg daily 30 tablet 2  . vitamin E 200 UNIT capsule Take 200 Units by mouth daily.    . SitaGLIPtin-MetFORMIN HCl (JANUMET XR) 50-1000 MG TB24 Take 1 tablet by mouth daily. (Patient not taking: Reported on 06/26/2016) 30 tablet 1   No current facility-administered medications on file prior to visit.         Objective:     BP 124/82   Pulse 70   Ht 5\' 7"  (1.702 m)   Wt 185 lb (83.9 kg)   LMP  (LMP Unknown)   SpO2 99%   BMI 28.98 kg/m   Wt Readings from Last 3 Encounters:  06/26/16 185 lb (83.9 kg)  03/10/16 183 lb (83 kg)  11/23/15 185 lb 12.8 oz (84.3 kg)   General appearance: alert, no distress, WD/WN Neck: supple, no lymphadenopathy, no thyromegaly, no masses Heart: RRR, normal S1, S2, no murmurs Lungs: CTA bilaterally, no wheezes, rhonchi, or rales  Ext: no edema Pulses: 2+ symmetric, upper and lower extremities, normal cap refill Neuro: non focal exam  Diabetic Foot Exam - Simple   Simple Foot Form Visual Inspection No deformities, no ulcerations, no other skin breakdown bilaterally:  Yes Sensation Testing Intact to touch and monofilament testing bilaterally:  Yes Pulse Check Posterior Tibialis and Dorsalis pulse intact bilaterally:  Yes Comments      Assessment & Plan:   Encounter Diagnoses  Name Primary?  . Diabetes mellitus with complication (HCC) Yes  . Essential hypertension   . Dyslipidemia     Diabetes- labs today, c/t same medications, glucose monitoring, advised regular exercise, healthy diet.  C/t daily foot checks.    Hypertension - c/t Losartan HCT 100/25mg  daily, c/t Cardizem 300mg  daily, c/t Atenolol 25mg  daily. Of note, home readings are  normal!  Hyperlipidemia - c/t Lipitor 80mg  daily, Aspirin daily   C/t all other medications as usual   There are no Patient Instructions on file for this visit. Brenda Hashimotoatricia was seen today for diabetes.  Diagnoses and all orders for this visit:  Diabetes mellitus with complication (HCC) -     HM DIABETES EYE EXAM -     HM DIABETES FOOT EXAM -     Hemoglobin A1c -     Comprehensive metabolic panel -     Microalbumin / creatinine urine ratio  Essential hypertension -     Comprehensive metabolic panel -     Microalbumin / creatinine urine ratio  Dyslipidemia -     Comprehensive metabolic panel

## 2016-06-27 LAB — HEMOGLOBIN A1C
HEMOGLOBIN A1C: 6.4 % — AB (ref <5.7)
MEAN PLASMA GLUCOSE: 137 mg/dL

## 2016-06-27 LAB — COMPREHENSIVE METABOLIC PANEL
ALT: 10 U/L (ref 6–29)
AST: 17 U/L (ref 10–35)
Albumin: 4 g/dL (ref 3.6–5.1)
Alkaline Phosphatase: 66 U/L (ref 33–130)
BILIRUBIN TOTAL: 0.3 mg/dL (ref 0.2–1.2)
BUN: 15 mg/dL (ref 7–25)
CHLORIDE: 106 mmol/L (ref 98–110)
CO2: 22 mmol/L (ref 20–31)
CREATININE: 1.04 mg/dL — AB (ref 0.50–0.99)
Calcium: 9.1 mg/dL (ref 8.6–10.4)
GLUCOSE: 88 mg/dL (ref 65–99)
Potassium: 4 mmol/L (ref 3.5–5.3)
SODIUM: 138 mmol/L (ref 135–146)
Total Protein: 6.7 g/dL (ref 6.1–8.1)

## 2016-06-27 LAB — MICROALBUMIN / CREATININE URINE RATIO
Creatinine, Urine: 73 mg/dL (ref 20–320)
MICROALB/CREAT RATIO: 11 ug/mg{creat} (ref <30)
Microalb, Ur: 0.8 mg/dL

## 2016-06-29 ENCOUNTER — Telehealth: Payer: Self-pay

## 2016-06-29 NOTE — Telephone Encounter (Signed)
Called  Notified pt of results

## 2016-08-07 ENCOUNTER — Other Ambulatory Visit: Payer: Self-pay | Admitting: Medical

## 2016-08-07 NOTE — Telephone Encounter (Signed)
Can she have a refill on this 

## 2016-08-15 ENCOUNTER — Other Ambulatory Visit: Payer: Self-pay | Admitting: Medical

## 2016-08-15 DIAGNOSIS — I10 Essential (primary) hypertension: Secondary | ICD-10-CM

## 2016-10-30 ENCOUNTER — Ambulatory Visit (INDEPENDENT_AMBULATORY_CARE_PROVIDER_SITE_OTHER): Payer: BLUE CROSS/BLUE SHIELD | Admitting: Medical

## 2016-10-30 ENCOUNTER — Encounter: Payer: Self-pay | Admitting: Medical

## 2016-10-30 VITALS — BP 120/68 | HR 58 | Wt 184.0 lb

## 2016-10-30 DIAGNOSIS — E118 Type 2 diabetes mellitus with unspecified complications: Secondary | ICD-10-CM

## 2016-10-30 DIAGNOSIS — E785 Hyperlipidemia, unspecified: Secondary | ICD-10-CM | POA: Diagnosis not present

## 2016-10-30 DIAGNOSIS — Z23 Encounter for immunization: Secondary | ICD-10-CM | POA: Insufficient documentation

## 2016-10-30 DIAGNOSIS — I10 Essential (primary) hypertension: Secondary | ICD-10-CM

## 2016-10-30 DIAGNOSIS — E559 Vitamin D deficiency, unspecified: Secondary | ICD-10-CM

## 2016-10-30 NOTE — Progress Notes (Signed)
Subjective:    Patient ID: Brenda [name], female    DOB: Oct 22, 1950, 66 y.o.   MRN: [id number]  Brenda [name] is a 66 y.o. female who presents for follow-up of Type 2 diabetes mellitus, HTN, hyperlipidemia.  Diabetes - here for diabetes.   Checking sugars, seeing good numbers.  No foot concerns.   She is compliant with other medications without c/o.    Compliant with BP medication, lipid medication.  No side effects.  No chest pain, swelling palpitations.    The following portions of the patient's history were reviewed and updated as appropriate: allergies, current medications, past medical history, past social history and problem list.  ROS as in subjective above.    Past Medical History:  Diagnosis Date  . Depression   . Diabetes mellitus   . Former smoker   . Hyperlipidemia   . Hypertension   . Osteoporosis    OSTEOPENIA  . Routine gynecological examination    Dr. Gaynell Face  . Vitamin D deficiency   . Wears dentures   . Wears glasses    Current Outpatient Prescriptions on File Prior to Visit  Medication Sig Dispense Refill  . ALPRAZolam (XANAX) 0.25 MG tablet Take 1 tablet (0.25 mg total) by mouth at bedtime as needed for anxiety. 90 tablet 0  . aspirin 325 MG EC tablet Take 325 mg by mouth daily.      Marland Kitchen atenolol (TENORMIN) 50 MG tablet take 1 tablet by mouth once daily 30 tablet 2  . atorvastatin (LIPITOR) 80 MG tablet take 1 tablet by mouth once daily AT 6PM 30 tablet 2  . buPROPion (WELLBUTRIN XL) 150 MG 24 hr tablet take 1 tablet by mouth once daily 30 tablet 2  . co-enzyme Q-10 50 MG capsule Take 1 capsule (50 mg total) by mouth daily. 90 capsule 3  . ergocalciferol (VITAMIN D2) 50000 units capsule Take 1 capsule (50,000 Units total) by mouth once a week. 12 capsule 3  . fish oil-omega-3 fatty acids 1000 MG capsule Take 1 g by mouth daily.      Marland Kitchen losartan-hydrochlorothiazide (HYZAAR) 100-25 MG tablet take 1 tablet by mouth once daily 30 tablet 3  .  Saxagliptin-Metformin (KOMBIGLYZE XR) 5-500 MG TB24 Take 1 tablet by mouth daily.    . sertraline (ZOLOFT) 100 MG tablet Pt takes 50 mg daily 30 tablet 2  . vitamin E 200 UNIT capsule Take 200 Units by mouth daily.    Marland Kitchen CARTIA XT 300 MG 24 hr capsule take 1 capsule by mouth once daily 30 capsule 3   No current facility-administered medications on file prior to visit.         Objective:    BP 120/68   Pulse (!) 58   Wt 184 lb (83.5 kg)   LMP  (LMP Unknown)   SpO2 96%   BMI 28.82 kg/m   Wt Readings from Last 3 Encounters:  10/30/16 184 lb (83.5 kg)  06/26/16 185 lb (83.9 kg)  03/10/16 183 lb (83 kg)   General appearance: alert, no distress, WD/WN Neck: supple, no lymphadenopathy, no thyromegaly, no masses, no bruits Heart: RRR, normal S1, S2, no murmurs Lungs: CTA bilaterally, no wheezes, rhonchi, or rales Ext: no edema Pulses: 2+ symmetric, upper and lower extremities, normal cap refill Neuro: non focal exam   Assessment & Plan:   Encounter Diagnoses  [name] Primary?  . Diabetes mellitus with complication (HCC) Yes  . Essential hypertension   . Dyslipidemia   .  Vitamin D deficiency   . Need for influenza vaccination     Diabetes- labs today, c/t same medications, glucose monitoring, advised regular exercise, healthy diet.  C/t daily foot checks.    Hypertension - c/t Losartan HCT 100/25mg  daily, c/t Cardizem  daily, c/t Atenolol  daily.  Hyperlipidemia - c/t Lipitor  daily, Aspirin daily. Goal LDL < 70.      C/t [name] other medications as usual  Counseled on the influenza virus vaccine.  Vaccine information sheet given.   High dose Influenza vaccine given after consent obtained.  Counseled on her family tension. She notes her kids disapprove of any man she tries to have relationship with.   She is widowed, but has long distance relationship with man in [place].   Brenda [name] was seen today for follow-up.  Diagnoses and [name] orders for this  visit:  Diabetes mellitus with complication (HCC) -     Lipid panel -     Hemoglobin A1c -     Comprehensive metabolic panel  Essential hypertension -     Lipid panel -     Hemoglobin A1c -     Comprehensive metabolic panel  Dyslipidemia -     Lipid panel -     Hemoglobin A1c -     Comprehensive metabolic panel  Vitamin D deficiency -     Lipid panel -     Hemoglobin A1c -     Comprehensive metabolic panel  Need for influenza vaccination -     Flu vaccine HIGH DOSE PF (Fluzone High dose)

## 2016-10-31 ENCOUNTER — Other Ambulatory Visit: Payer: Self-pay | Admitting: Medical

## 2016-10-31 DIAGNOSIS — I10 Essential (primary) hypertension: Secondary | ICD-10-CM

## 2016-10-31 LAB — COMPREHENSIVE METABOLIC PANEL
AG RATIO: 1.6 (calc) (ref 1.0–2.5)
ALKALINE PHOSPHATASE (APISO): 82 U/L (ref 33–130)
ALT: 12 U/L (ref 6–29)
AST: 16 U/L (ref 10–35)
Albumin: 4.2 g/dL (ref 3.6–5.1)
BUN / CREAT RATIO: 21 (calc) (ref 6–22)
BUN: 21 mg/dL (ref 7–25)
CALCIUM: 9.6 mg/dL (ref 8.6–10.4)
CHLORIDE: 104 mmol/L (ref 98–110)
CO2: 27 mmol/L (ref 20–32)
Creat: 1.02 mg/dL — ABNORMAL HIGH (ref 0.50–0.99)
GLOBULIN: 2.7 g/dL (ref 1.9–3.7)
Glucose, Bld: 82 mg/dL (ref 65–99)
Potassium: 4.2 mmol/L (ref 3.5–5.3)
Sodium: 140 mmol/L (ref 135–146)
Total Bilirubin: 0.4 mg/dL (ref 0.2–1.2)
Total Protein: 6.9 g/dL (ref 6.1–8.1)

## 2016-10-31 LAB — LIPID PANEL
CHOLESTEROL: 193 mg/dL (ref <200)
HDL: 57 mg/dL (ref >50)
LDL CHOLESTEROL (CALC): 118 mg/dL — AB
Non-HDL Cholesterol (Calc): 136 mg/dL (calc) — ABNORMAL HIGH (ref <130)
TRIGLYCERIDES: 85 mg/dL (ref <150)
Total CHOL/HDL Ratio: 3.4 (calc) (ref <5.0)

## 2016-10-31 LAB — HEMOGLOBIN A1C
EAG (MMOL/L): 7.7 (calc)
Hgb A1c MFr Bld: 6.5 % of total Hgb — ABNORMAL HIGH (ref <5.7)
Mean Plasma Glucose: 140 (calc)

## 2016-10-31 MED ORDER — LOSARTAN POTASSIUM-HCTZ 100-25 MG PO TABS: 1.0000 | ORAL_TABLET | Freq: Every day | ORAL | 3 refills | Status: DC

## 2016-10-31 MED ORDER — ROSUVASTATIN CALCIUM 20 MG PO TABS: 20.0000 mg | ORAL_TABLET | Freq: Every day | ORAL | 1 refills | Status: DC

## 2016-10-31 MED ORDER — ERGOCALCIFEROL 1.25 MG (50000 UT) PO CAPS: 50000.0000 [IU] | ORAL_CAPSULE | ORAL | 3 refills | Status: DC

## 2016-10-31 MED ORDER — CO-ENZYME Q-10 50 MG PO CAPS: 50.0000 mg | ORAL_CAPSULE | Freq: Every day | ORAL | 3 refills | Status: AC

## 2016-10-31 MED ORDER — DILTIAZEM HCL ER COATED BEADS 300 MG PO CP24
300.0000 mg | ORAL_CAPSULE | Freq: Every day | ORAL | 3 refills | Status: DC
Start: 2016-10-31 — End: 2017-08-07

## 2016-10-31 MED ORDER — SAXAGLIPTIN-METFORMIN ER 5-500 MG PO TB24
1.0000 | ORAL_TABLET | Freq: Every day | ORAL | 3 refills | Status: DC
Start: 2016-10-31 — End: 2017-08-07

## 2016-10-31 MED ORDER — ATENOLOL 50 MG PO TABS: 50.0000 mg | ORAL_TABLET | Freq: Every day | ORAL | 3 refills | Status: DC

## 2016-10-31 MED ORDER — BUPROPION HCL ER (XL) 150 MG PO TB24: 150.0000 mg | ORAL_TABLET | Freq: Every day | ORAL | 1 refills | Status: DC

## 2016-10-31 MED ORDER — ASPIRIN 325 MG PO TBEC: 325.0000 mg | DELAYED_RELEASE_TABLET | Freq: Every day | ORAL | 3 refills | Status: DC

## 2016-11-06 ENCOUNTER — Other Ambulatory Visit: Payer: Self-pay | Admitting: Medical

## 2016-11-06 DIAGNOSIS — Z1231 Encounter for screening mammogram for malignant neoplasm of breast: Secondary | ICD-10-CM

## 2016-11-20 ENCOUNTER — Ambulatory Visit: Payer: Managed Care, Other (non HMO)

## 2016-11-24 ENCOUNTER — Ambulatory Visit
Admission: RE | Admit: 2016-11-24 | Discharge: 2016-11-24 | Disposition: A | Discharge status: Home / Self Care | Payer: BLUE CROSS/BLUE SHIELD | Source: Ambulatory Visit | Attending: Medical | Admitting: Medical

## 2016-11-24 DIAGNOSIS — Z1231 Encounter for screening mammogram for malignant neoplasm of breast: Secondary | ICD-10-CM

## 2016-12-12 ENCOUNTER — Other Ambulatory Visit: Payer: Self-pay | Admitting: Medical

## 2016-12-12 NOTE — Telephone Encounter (Signed)
Can pt have a refill on meds  

## 2017-03-16 ENCOUNTER — Ambulatory Visit (INDEPENDENT_AMBULATORY_CARE_PROVIDER_SITE_OTHER): Payer: Medicare Other | Admitting: Medical

## 2017-03-16 VITALS — BP 122/80 | HR 58 | Wt 187.8 lb

## 2017-03-16 DIAGNOSIS — E785 Hyperlipidemia, unspecified: Secondary | ICD-10-CM

## 2017-03-16 DIAGNOSIS — E118 Type 2 diabetes mellitus with unspecified complications: Secondary | ICD-10-CM

## 2017-03-16 DIAGNOSIS — I10 Essential (primary) hypertension: Secondary | ICD-10-CM

## 2017-03-16 NOTE — Progress Notes (Signed)
Subjective:    Patient ID: Brenda [name], female    DOB: 10-01-1950, 67 y.o.   MRN: [id number]  Brenda Heysatricia D Wilton is a 67 y.o. female who presents for follow-up of Type 2 diabetes mellitus, HTN, hyperlipidemia.  Diabetes - here for diabetes.   Checking sugars, seeing good numbers.  No foot concerns.   She is compliant with other medications without c/o.    Compliant with BP medication, lipid medication.  No side effects.  No chest pain, swelling palpitations.    She plans to retire this fall.  The following portions of the patient's history were reviewed and updated as appropriate: allergies, current medications, past medical history, past social history and problem list.  ROS as in subjective above.    Past Medical History:  Diagnosis Date  . Depression   . Diabetes mellitus   . Former smoker   . Hyperlipidemia   . Hypertension   . Osteoporosis    OSTEOPENIA  . Routine gynecological examination    Dr. Gaynell FaceMarshall  . Vitamin D deficiency   . Wears dentures   . Wears glasses    Current Outpatient Medications on File Prior to Visit  Medication Sig Dispense Refill  . ALPRAZolam (XANAX) 0.25 MG tablet Take 1 tablet (0.25 mg total) by mouth at bedtime as needed for anxiety. 90 tablet 0  . aspirin 325 MG EC tablet Take 1 tablet (325 mg total) by mouth daily. 90 tablet 3  . atenolol (TENORMIN) 50 MG tablet Take 1 tablet (50 mg total) by mouth daily. 90 tablet 3  . buPROPion (WELLBUTRIN XL) 150 MG 24 hr tablet Take 1 tablet (150 mg total) by mouth daily. 90 tablet 1  . co-enzyme Q-10 50 MG capsule Take 1 capsule (50 mg total) by mouth daily. 90 capsule 3  . diltiazem (CARTIA XT) 300 MG 24 hr capsule Take 1 capsule (300 mg total) by mouth daily. 90 capsule 3  . ergocalciferol (VITAMIN D2) 50000 units capsule Take 1 capsule (50,000 Units total) by mouth once a week. 12 capsule 3  . fish oil-omega-3 fatty acids 1000 MG capsule Take 1 g by mouth daily.      Marland Kitchen.  losartan-hydrochlorothiazide (HYZAAR) 100-25 MG tablet Take 1 tablet by mouth daily. 90 tablet 3  . rosuvastatin (CRESTOR) 20 MG tablet Take 1 tablet (20 mg total) by mouth at bedtime. 90 tablet 1  . Saxagliptin-Metformin (KOMBIGLYZE XR) 5-500 MG TB24 Take 1 tablet by mouth daily. 90 tablet 3  . sertraline (ZOLOFT) 100 MG tablet take 1/2 tablet by mouth once daily 30 tablet 2  . vitamin E 200 UNIT capsule Take 200 Units by mouth daily.     No current facility-administered medications on file prior to visit.         Objective:    BP 122/80   Pulse (!) 58   Wt 187 lb 12.8 oz (85.2 kg)   LMP  (LMP Unknown)   SpO2 97%   BMI 29.41 kg/m   Wt Readings from Last 3 Encounters:  03/16/17 187 lb 12.8 oz (85.2 kg)  10/30/16 184 lb (83.5 kg)  06/26/16 185 lb (83.9 kg)   General appearance: alert, no distress, WD/WN Neck: supple, no lymphadenopathy, no thyromegaly, no masses, no bruits Heart: RRR, normal S1, S2, no murmurs Lungs: CTA bilaterally, no wheezes, rhonchi, or rales Ext: no edema Pulses: 2+ symmetric, upper and lower extremities, normal cap refill Neuro: non focal exam  Diabetic Foot Exam - Simple   Simple  Foot Form Diabetic Foot exam was performed with the following findings:  Yes 03/16/2017  7:49 PM  Visual Inspection No deformities, no ulcerations, no other skin breakdown bilaterally:  Yes Sensation Testing Intact to touch and monofilament testing bilaterally:  Yes Pulse Check Posterior Tibialis and Dorsalis pulse intact bilaterally:  Yes Comments      Assessment & Plan:   Encounter Diagnoses  Name Primary?  . Dyslipidemia Yes  . Essential hypertension   . Diabetes mellitus with complication (HCC)     Diabetes- labs today, c/t same medications, glucose monitoring, advised regular exercise, healthy diet.  C/t daily foot checks.    Hypertension - c/t Losartan HCT 100/25mg  daily, c/t Cardizem 300mg  daily, c/t Atenolol 50mg  daily.  Hyperlipidemia - last visit we  changed from Lipitor to Crestor.  Lab today.      C/t all other medications as usual   Marlea was seen today for diabetes.  Diagnoses and all orders for this visit:  Dyslipidemia -     Lipid panel  Essential hypertension -     Comprehensive metabolic panel  Diabetes mellitus with complication (HCC)

## 2017-03-17 LAB — COMPREHENSIVE METABOLIC PANEL
A/G RATIO: 1.7 (ref 1.2–2.2)
ALK PHOS: 80 IU/L (ref 39–117)
ALT: 13 IU/L (ref 0–32)
AST: 16 IU/L (ref 0–40)
Albumin: 4.1 g/dL (ref 3.6–4.8)
BUN / CREAT RATIO: 15 (ref 12–28)
BUN: 15 mg/dL (ref 8–27)
CHLORIDE: 103 mmol/L (ref 96–106)
CO2: 22 mmol/L (ref 20–29)
Calcium: 9.2 mg/dL (ref 8.7–10.3)
Creatinine, Ser: 1.03 mg/dL — ABNORMAL HIGH (ref 0.57–1.00)
GFR calc Af Amer: 65 mL/min/{1.73_m2} (ref >59)
GFR calc non Af Amer: 56 mL/min/{1.73_m2} — ABNORMAL LOW (ref >59)
GLUCOSE: 127 mg/dL — AB (ref 65–99)
Globulin, Total: 2.4 g/dL (ref 1.5–4.5)
POTASSIUM: 4.3 mmol/L (ref 3.5–5.2)
Sodium: 140 mmol/L (ref 134–144)
Total Protein: 6.5 g/dL (ref 6.0–8.5)

## 2017-03-17 LAB — LIPID PANEL
CHOLESTEROL TOTAL: 145 mg/dL (ref 100–199)
Chol/HDL Ratio: 2.7 ratio (ref 0.0–4.4)
HDL: 53 mg/dL (ref >39)
LDL Calculated: 75 mg/dL (ref 0–99)
Triglycerides: 86 mg/dL (ref 0–149)
VLDL Cholesterol Cal: 17 mg/dL (ref 5–40)

## 2017-03-19 ENCOUNTER — Other Ambulatory Visit: Payer: Self-pay | Admitting: Medical

## 2017-03-19 MED ORDER — ASPIRIN EC 81 MG PO TBEC: 81.0000 mg | DELAYED_RELEASE_TABLET | Freq: Every day | ORAL | 3 refills | Status: DC

## 2017-03-19 MED ORDER — SERTRALINE HCL 100 MG PO TABS: 50.0000 mg | ORAL_TABLET | Freq: Every day | ORAL | 1 refills | Status: DC

## 2017-04-27 ENCOUNTER — Other Ambulatory Visit: Payer: Self-pay | Admitting: Medical

## 2017-05-23 ENCOUNTER — Telehealth: Payer: Self-pay | Admitting: Medical

## 2017-05-23 NOTE — Telephone Encounter (Signed)
I received notice from their insurer or pharmacy notifying of a recall on certain lots of Losartan blood pressure medication   Have them call pharmacy to check to see if the Losartan medication is one on the recall list   Not all versions of Losartan affected.  Keep me informed.  We can certainly switch if needed or if their lot is affected. 

## 2017-05-24 NOTE — Telephone Encounter (Signed)
Called patient and advised of message. LVM with call back number if questions or concerns.

## 2017-07-10 ENCOUNTER — Telehealth: Payer: Self-pay

## 2017-07-10 NOTE — Telephone Encounter (Signed)
Received fax refill request for lasartan/HCTZ 100/25mg  tabs to be sent into [facility]

## 2017-07-11 ENCOUNTER — Other Ambulatory Visit: Payer: Self-pay

## 2017-07-11 MED ORDER — LOSARTAN POTASSIUM-HCTZ 100-25 MG PO TABS: 1.0000 | ORAL_TABLET | Freq: Every day | ORAL | 0 refills | Status: DC

## 2017-08-06 ENCOUNTER — Ambulatory Visit (INDEPENDENT_AMBULATORY_CARE_PROVIDER_SITE_OTHER): Payer: Medicare Other | Admitting: Medical

## 2017-08-06 VITALS — BP 132/80 | HR 56 | Temp 97.9°F | Resp 16 | Ht 66.5 in | Wt 184.8 lb

## 2017-08-06 DIAGNOSIS — Z7185 Encounter for immunization safety counseling: Secondary | ICD-10-CM

## 2017-08-06 DIAGNOSIS — F325 Major depressive disorder, single episode, in full remission: Secondary | ICD-10-CM

## 2017-08-06 DIAGNOSIS — E2839 Other primary ovarian failure: Secondary | ICD-10-CM | POA: Insufficient documentation

## 2017-08-06 DIAGNOSIS — I10 Essential (primary) hypertension: Secondary | ICD-10-CM

## 2017-08-06 DIAGNOSIS — M8580 Other specified disorders of bone density and structure, unspecified site: Secondary | ICD-10-CM | POA: Diagnosis not present

## 2017-08-06 DIAGNOSIS — E559 Vitamin D deficiency, unspecified: Secondary | ICD-10-CM

## 2017-08-06 DIAGNOSIS — E785 Hyperlipidemia, unspecified: Secondary | ICD-10-CM

## 2017-08-06 DIAGNOSIS — E118 Type 2 diabetes mellitus with unspecified complications: Secondary | ICD-10-CM | POA: Diagnosis not present

## 2017-08-06 DIAGNOSIS — Z7189 Other specified counseling: Secondary | ICD-10-CM | POA: Diagnosis not present

## 2017-08-06 DIAGNOSIS — Z0000 Encounter for general adult medical examination without abnormal findings: Secondary | ICD-10-CM | POA: Diagnosis not present

## 2017-08-06 NOTE — Patient Instructions (Addendum)
Thanks for trusting [place] with your health care and for coming in for a physical today.  Below are some general recommendations I have for you:  Yearly screenings  See your eye doctor yearly for routine vision care. See your dentist yearly for routine dental care including hygiene visits twice yearly. See me here yearly for a routine physical and preventative care visit See your gynecologist yearly for routine gynecological care.  Cancer screening Colon cancer screening:   Consider Cologuard stool testing  Breast cancer screening -  Please call to schedule your mammogram at your convenience.  [facility]   Or    [facility]   [phone]          [phone] [address]. [address], [address]       [address] [place], [place] [address]        [place], [place] [address]  Osteoporosis screening/Bone Density test - I recommend an updated bone density as your last one 2016 showed osteopenia  Shingles vaccine:  I recommend you have a shingles vaccine to help prevent shingles or herpes zoster outbreak.   Please call your insurer to inquire about coverage for the Shingrix vaccine given in 2 doses.   Some insurers cover this vaccine after age 48, some cover this after age 85.  If your insurer covers this, then call to schedule appointment to have this vaccine here.  I also recommend checking insurance coverage for Prevnar 13 vaccine, as I recommend you have this.  Please follow up yearly for a physical.    I have included other useful information below for your review.  Preventative Care for Adults - Female      MAINTAIN REGULAR HEALTH EXAMS:  A routine yearly physical is a good way to check in with your primary care provider about your health and preventive screening. It is also an opportunity to share updates about your health and any concerns you have, and receive a thorough all-over exam.   Most health insurance companies pay for at least  some preventative services.  Check with your health plan for specific coverages.  WHAT PREVENTATIVE SERVICES DO WOMEN NEED?  Adult women should have their weight and blood pressure checked regularly.   Women age 32 and older should have their cholesterol levels checked regularly.  Women should be screened for cervical cancer with a Pap smear and pelvic exam beginning at either age 75, or 3 years after they become sexually activity.    Breast cancer screening generally begins at age 60 with a mammogram and breast exam by your primary care provider.    Beginning at age 16 and continuing to age 72, women should be screened for colorectal cancer.  Certain people may need continued testing until age 38.  Updating vaccinations is part of preventative care.  Vaccinations help protect against diseases such as the flu.  Osteoporosis is a disease in which the bones lose minerals and strength as we age. Women ages 8 and over should discuss this with their caregivers, as should women after menopause who have other risk factors.  Lab tests are generally done as part of preventative care to screen for anemia and blood disorders, to screen for problems with the kidneys and liver, to screen for bladder problems, to check blood sugar, and to check your cholesterol level.  Preventative services generally include counseling about diet, exercise, avoiding tobacco, drugs, excessive alcohol consumption, and sexually transmitted infections.    GENERAL RECOMMENDATIONS  FOR GOOD HEALTH:  Healthy diet:  Eat a variety of foods, including fruit, vegetables, animal or vegetable protein, such as meat, fish, chicken, and eggs, or beans, lentils, tofu, and grains, such as rice.  Drink plenty of water daily.  Decrease saturated fat in the diet, avoid lots of red meat, processed foods, sweets, fast foods, and fried foods.  Exercise:  Aerobic exercise helps maintain good heart health. At least 30-40 minutes of  moderate-intensity exercise is recommended. For example, a brisk walk that increases your heart rate and breathing. This should be done on most days of the week.   Find a type of exercise or a variety of exercises that you enjoy so that it becomes a part of your daily life.  Examples are running, walking, swimming, water aerobics, and biking.  For motivation and support, explore group exercise such as aerobic class, spin class, Zumba, Yoga,or  martial arts, etc.    Set exercise goals for yourself, such as a certain weight goal, walk or run in a race such as a 5k walk/run.  Speak to your primary care provider about exercise goals.  Disease prevention:  If you smoke or chew tobacco, find out from your caregiver how to quit. It can literally save your life, no matter how long you have been a tobacco user. If you do not use tobacco, never begin.   Maintain a healthy diet and normal weight. Increased weight leads to problems with blood pressure and diabetes.   The Body Mass Index or BMI is a way of measuring how much of your body is fat. Having a BMI above 27 increases the risk of heart disease, diabetes, hypertension, stroke and other problems related to obesity. Your caregiver can help determine your BMI and based on it develop an exercise and dietary program to help you achieve or maintain this important measurement at a healthful level.  High blood pressure causes heart and blood vessel problems.  Persistent high blood pressure should be treated with medicine if weight loss and exercise do not work.   Fat and cholesterol leaves deposits in your arteries that can block them. This causes heart disease and vessel disease elsewhere in your body.  If your cholesterol is found to be high, or if you have heart disease or certain other medical conditions, then you may need to have your cholesterol monitored frequently and be treated with medication.   Ask if you should have a cardiac stress test if your  history suggests this. A stress test is a test done on a treadmill that looks for heart disease. This test can find disease prior to there being a problem.  Menopause can be associated with physical symptoms and risks. Hormone replacement therapy is available to decrease these. You should talk to your caregiver about whether starting or continuing to take hormones is right for you.   Osteoporosis is a disease in which the bones lose minerals and strength as we age. This can result in serious bone fractures. Risk of osteoporosis can be identified using a bone density scan. Women ages 107 and over should discuss this with their caregivers, as should women after menopause who have other risk factors. Ask your caregiver whether you should be taking a calcium supplement and Vitamin D, to reduce the rate of osteoporosis.   Avoid drinking alcohol in excess (more than two drinks per day).  Avoid use of street drugs. Do not share needles with anyone. Ask for professional help if you need assistance  or instructions on stopping the use of alcohol, cigarettes, and/or drugs.  Brush your teeth twice a day with fluoride toothpaste, and floss once a day. Good oral hygiene prevents tooth decay and gum disease. The problems can be painful, unattractive, and can cause other health problems. Visit your dentist for a routine oral and dental check up and preventive care every 6-12 months.   Look at your skin regularly.  Use a mirror to look at your back. Notify your caregivers of changes in moles, especially if there are changes in shapes, colors, a size larger than a pencil eraser, an irregular border, or development of new moles.  Safety:  Use seatbelts 100% of the time, whether driving or as a passenger.  Use safety devices such as hearing protection if you work in environments with loud noise or significant background noise.  Use safety glasses when doing any work that could send debris in to the eyes.  Use a helmet if  you ride a bike or motorcycle.  Use appropriate safety gear for contact sports.  Talk to your caregiver about gun safety.  Use sunscreen with a SPF (or skin protection factor) of 15 or greater.  Lighter skinned people are at a greater risk of skin cancer. Don't forget to also wear sunglasses in order to protect your eyes from too much damaging sunlight. Damaging sunlight can accelerate cataract formation.   Practice safe sex. Use condoms. Condoms are used for birth control and to help reduce the spread of sexually transmitted infections (or STIs).  Some of the STIs are gonorrhea (the clap), chlamydia, syphilis, trichomonas, herpes, HPV (human papilloma virus) and HIV (human immunodeficiency virus) which causes AIDS. The herpes, HIV and HPV are viral illnesses that have no cure. These can result in disability, cancer and death.   Keep carbon monoxide and smoke detectors in your home functioning at all times. Change the batteries every 6 months or use a model that plugs into the wall.   Vaccinations:  Stay up to date with your tetanus shots and other required immunizations. You should have a booster for tetanus every 10 years. Be sure to get your flu shot every year, since 5%-20% of the [place] population comes down with the flu. The flu vaccine changes each year, so being vaccinated once is not enough. Get your shot in the fall, before the flu season peaks.   Other vaccines to consider:  Human Papilloma Virus or HPV causes cancer of the cervix, and other infections that can be transmitted from person to person. There is a vaccine for HPV, and females should get immunized between the ages of 78 and 41. It requires a series of 3 shots.   Pneumococcal vaccine to protect against certain types of pneumonia.  This is normally recommended for adults age 47 or older.  However, adults younger than 68 years old with certain underlying conditions such as diabetes, heart or lung disease should also receive the  vaccine.  Shingles vaccine to protect against Varicella Zoster if you are older than age 7, or younger than 68 years old with certain underlying illness.  If you have not had the Shingrix vaccine, please call your insurer to inquire about coverage for the Shingrix vaccine given in 2 doses.   Some insurers cover this vaccine after age 40, some cover this after age 2.  If your insurer covers this, then call to schedule appointment to have this vaccine here  Hepatitis A vaccine to protect against a form of  infection of the liver by a virus acquired from food.  Hepatitis B vaccine to protect against a form of infection of the liver by a virus acquired from blood or body fluids, particularly if you work in health care.  If you plan to travel internationally, check with your local health department for specific vaccination recommendations.  Cancer Screening:  Breast cancer screening is essential to preventive care for women. All women age 68 and older should perform a breast self-exam every month. At age 68 and older, women should have their caregiver complete a breast exam each year. Women at ages 540 and older should have a mammogram (x-ray film) of the breasts. Your caregiver can discuss how often you need mammograms.    Cervical cancer screening includes taking a Pap smear (sample of cells examined under a microscope) from the cervix (end of the uterus). It also includes testing for HPV (Human Papilloma Virus, which can cause cervical cancer). Screening and a pelvic exam should begin at age 68, or 3 years after a woman becomes sexually active. Screening should occur every year, with a Pap smear but no HPV testing, up to age 68. After age 68, you should have a Pap smear every 3 years with HPV testing, if no HPV was found previously.   Most routine colon cancer screening begins at the age of 68. On a yearly basis, doctors may provide special easy to use take-home tests to check for hidden blood in the  stool. Sigmoidoscopy or colonoscopy can detect the earliest forms of colon cancer and is life saving. These tests use a small camera at the end of a tube to directly examine the colon. Speak to your caregiver about this at age 68, when routine screening begins (and is repeated every 5 years unless early forms of pre-cancerous polyps or small growths are found).

## 2017-08-06 NOTE — Progress Notes (Signed)
Subjective:    Brenda [name] is a 67 y.o. female who presents for Preventative Services visit and chronic medical problems/med check visit.    Primary Care Provider Myalynn Lingle, Kermit Baloavid S, PA-C here for primary care  Current Health Care Team:  Dentist  Eye doctor  gynecology  Medical Services you may have received from other than [facility] providers in the past year (date may be approximate) none  Exercise Current exercise habits: walking   Nutrition/Diet Current diet: in general, a "healthy" diet    Depression Screen [facility]/2017  Decreased Interest 0  Down, Depressed, Hopeless 0  PHQ - 2 Score 0    Activities of Daily Living Screen/Functional Status Survey Is the patient deaf or have difficulty hearing?: No Does the patient have difficulty seeing, even when wearing glasses/contacts?: No Does the patient have difficulty concentrating, remembering, or making decisions?: No Does the patient have difficulty walking or climbing stairs?: No Does the patient have difficulty dressing or bathing?: No Does the patient have difficulty doing errands alone such as visiting a doctor's office or shopping?: No  Can patient draw a clock face showing 3:15 oclock, yes  Fall Risk Screen Fall Risk  08/06/2017 10/30/2016 06/26/2016 11/25/2014  Falls in the past year? No No No No    Gait Assessment: Normal gait observed yes  Advanced directives Does patient have a Health Care Power of Attorney? No Does patient have a Living Will? No  Past Medical History:  Diagnosis Date  . Depression   . Diabetes mellitus   . Former smoker   . Hyperlipidemia   . Hypertension   . Osteoporosis    OSTEOPENIA  . Routine gynecological examination    Dr. Gaynell FaceMarshall  . Vitamin D deficiency   . Wears dentures   . Wears glasses     Past Surgical History:  Procedure Laterality Date  . CESAREAN SECTION  04/12/1983  . COLONOSCOPY  2006   declines further colonoscopy    Social  History   Socioeconomic History  . Marital status: Widowed    Spouse name: Not on file  . Number of children: Not on file  . Years of education: Not on file  . Highest education level: Not on file  Occupational History  . Not on file  Social Needs  . Financial resource strain: Not on file  . Food insecurity:    Worry: Not on file    Inability: Not on file  . Transportation needs:    Medical: Not on file    Non-medical: Not on file  Tobacco Use  . Smoking status: Former Games developermoker  . Smokeless tobacco: Never Used  Substance and Sexual Activity  . Alcohol use: No  . Drug use: No  . Sexual activity: Not on file  Lifestyle  . Physical activity:    Days per week: Not on file    Minutes per session: Not on file  . Stress: Not on file  Relationships  . Social connections:    Talks on phone: Not on file    Gets together: Not on file    Attends religious service: Not on file    Active member of club or organization: Not on file    Attends meetings of clubs or organizations: Not on file    Relationship status: Not on file  . Intimate partner violence:    Fear of current or ex partner: Not on file    Emotionally abused: Not on file    Physically  abused: Not on file    Forced sexual activity: Not on file  Other Topics Concern  . 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  Lives with her daughter.   Former smoker,  Uses non nicotine vape now.      Family History  Problem Relation Age of Onset  . Heart disease Father        valvuloplasty  . Hypertension Mother   . Heart disease Mother        died at 45 with angioplasty complication  . Hypertension Sister   . Other Sister        complications of HIV  . Hypertension Sister   . Hypertension Sister      Current Outpatient Medications:  .  ALPRAZolam (XANAX) 0.25 MG tablet, Take 1 tablet (0.25 mg total) by mouth at bedtime as needed for  anxiety., Disp: 90 tablet, Rfl: 0 .  aspirin EC 81 MG tablet, Take 1 tablet (81 mg total) by mouth daily., Disp: 90 tablet, Rfl: 3 .  atenolol (TENORMIN) 50 MG tablet, Take 1 tablet (50 mg total) by mouth daily., Disp: 90 tablet, Rfl: 3 .  buPROPion (WELLBUTRIN XL) 150 MG 24 hr tablet, TAKE 1 TABLET BY MOUTH ONCE DAILY, Disp: 90 tablet, Rfl: 0 .  co-enzyme Q-10 50 MG capsule, Take 1 capsule (50 mg total) by mouth daily., Disp: 90 capsule, Rfl: 3 .  diltiazem (CARTIA XT) 300 MG 24 hr capsule, Take 1 capsule (300 mg total) by mouth daily., Disp: 90 capsule, Rfl: 3 .  ergocalciferol (VITAMIN D2) 50000 units capsule, Take 1 capsule (50,000 Units total) by mouth once a week., Disp: 12 capsule, Rfl: 3 .  fish oil-omega-3 fatty acids 1000 MG capsule, Take 1 g by mouth daily.  , Disp: , Rfl:  .  losartan-hydrochlorothiazide (HYZAAR) 100-25 MG tablet, Take 1 tablet by mouth daily., Disp: 90 tablet, Rfl: 0 .  rosuvastatin (CRESTOR) 20 MG tablet, TAKE 1 TABLET BY MOUTH AT BEDTIME, Disp: 90 tablet, Rfl: 0 .  Saxagliptin-Metformin (KOMBIGLYZE XR) 5-500 MG TB24, Take 1 tablet by mouth daily., Disp: 90 tablet, Rfl: 3 .  sertraline (ZOLOFT) 100 MG tablet, Take 0.5 tablets (50 mg total) by mouth daily., Disp: 90 tablet, Rfl: 1 .  vitamin E 200 UNIT capsule, Take 200 Units by mouth daily., Disp: , Rfl:   Allergies  Allergen Reactions  . Janumet Xr [Sitagliptin-Metformin Hcl Er]     Bad headaches  . Metformin And Related     Nausea, anorexia  . Nuprin [Ibuprofen] Hives    Can tolerate generic Ibuprofen    History reviewed: allergies, current medications, past family history, past medical history, past social history, past surgical history and problem list   Objective:      Biometrics BP 132/80   Pulse (!) 56   Temp 97.9 F (36.6 C) (Oral)   Resp 16   Ht 5' 6.5" (1.689 m)   Wt 184 lb 12.8 oz (83.8 kg)   LMP  (LMP Unknown)   SpO2 97%   BMI 29.38 kg/m   Gen: wd, wn, nad , aa female HEENT:  normocephalic, sclerae anicteric, TMs pearly, nares patent, no discharge or erythema, pharynx normal Oral cavity: MMM, no lesions Neck: supple, no lymphadenopathy, no thyromegaly, no masses, no bruits Heart: RRR, normal S1, S2, no murmurs Lungs: CTA bilaterally, no wheezes, rhonchi, or rales Abdomen: +bs,  soft, non tender, non distended, no masses, no hepatomegaly, no splenomegaly Musculoskeletal: nontender, no swelling, no obvious deformity Extremities: no edema, no cyanosis, no clubbing Pulses: 2+ symmetric, upper and lower extremities, normal cap refill Neurological: alert, oriented x 3, CN2-12 intact, strength normal upper extremities and lower extremities, sensation normal throughout, DTRs 2+ throughout, no cerebellar signs, gait normal Psychiatric: normal affect, behavior normal, pleasant      Adult ECG Report  Indication: medicare well visit  Rate: 60 bpm  Rhythm: normal sinus rhythm  QRS Axis: -5 degrees  PR Interval:  QRS Duration: 70ms  QTc:  Conduction Disturbances: none  Other Abnormalities: none  Patient's cardiac risk factors are: dyslipidemia and hypertension.  EKG comparison: unchanged from 2016 EKG  Narrative Interpretation: no acute changes     Assessment:   Encounter Diagnoses  Name Primary?  . Essential hypertension Yes  . Medicare annual wellness visit, initial   . Diabetes mellitus with complication (HCC)   . Osteopenia, unspecified location   . Vitamin D deficiency   . Vaccine counseling   . Depression, major, in remission (HCC)   . Dyslipidemia   . Estrogen deficiency      Plan:   A preventative services visit was completed today.  During the course of the visit today, we discussed and counseled about appropriate screening and preventive services.  A health risk assessment was established today that included a review of current medications, allergies, social history, family history, medical and preventative health history,  biometrics, and preventative screenings to identify potential safety concerns or impairments.  A personalized plan was printed today for your records and use.   Personalized health advice and education was given today to reduce health risks and promote self management and wellness.  Information regarding end of life planning was discussed today.  Conditions/risks identified: none  Recommendations:  I recommend a yearly ophthalmology/optometry visit for glaucoma screening and eye checkup  I recommended a yearly dental visit for hygiene and checkup  Advanced directives - discussed nature and purpose of Advanced Directives, encouraged them to complete them if they have not done so and/or encouraged them to get [place] a copy if they have done this already.  Cancer screening Colon cancer screening:   Consider Cologuard stool testing  Breast cancer screening -  Please call to schedule your mammogram at your convenience.  [facility]   Or    [facility]   [phone]          [phone] [address]       [address] [place], [place] [address]        [place], [place] [address]  Osteoporosis screening/Bone Density test - I recommend an updated bone density as your last one 2016 showed osteopenia  Shingles vaccine:  I recommend you have a shingles vaccine to help prevent shingles or herpes zoster outbreak.   Please call your insurer to inquire about coverage for the Shingrix vaccine given in 2 doses.   Some insurers cover this vaccine after age 26, some cover this after age 59.  If your insurer covers this, then call to schedule appointment to have this vaccine here.  I also recommend checking insurance coverage for Prevnar 13 vaccine, as I recommend you have this.  Medicare Attestation A preventative services visit was completed today.  During the course of the visit the patient was educated and counseled about appropriate  screening and preventive services.  A  health risk assessment was established with the patient that included a review of current medications, allergies, social history, family history, medical and preventative health history, biometrics, and preventative screenings to identify potential safety concerns or impairments.  A personalized plan was printed today for the patient's records and use.   Personalized health advice and education was given today to reduce health risks and promote self management and wellness.  Information regarding end of life planning was discussed today.  Kristian Covey, PA-C   08/06/2017

## 2017-08-06 NOTE — Progress Notes (Signed)
Subjective:   HPI  Brenda [name] is a 67 y.o. female who presents for a complete physical.  Chief Complaint  Patient presents with  . AWV    welcome to medicare visit.  fasting cpe eye exam 01-2017   Concerns: Compliant with BP medication without c/o, but BPs running about the same as here  compliant with cholesterol medication without c/o  compliant with depression and anxiety medications, doing fine, no c/o.  Still has ongoing right knee pain, worse with prolonged walking, and this does limit exercise at times.  Needs refills to [facility]   See gyn for routine pap and mammogram, last bone density 2009  Refuses colonoscopy  Reviewed their medical, surgical, family, social, medication, and allergy history and updated chart as appropriate.  Past Medical History:  Diagnosis Date  . Depression   . Diabetes mellitus   . Former smoker   . Hyperlipidemia   . Hypertension   . Osteoporosis    OSTEOPENIA  . Routine gynecological examination    Dr. Gaynell FaceMarshall  . Vitamin D deficiency   . Wears dentures   . Wears glasses     Past Surgical History:  Procedure Laterality Date  . CESAREAN SECTION  04/12/1983  . COLONOSCOPY  2006   declines further colonoscopy    Social History   Socioeconomic History  . Marital status: Widowed    Spouse name: Not on file  . Number of children: Not on file  . Years of education: Not on file  . Highest education level: Not on file  Occupational History  . Not on file  Social Needs  . Financial resource strain: Not on file  . Food insecurity:    Worry: Not on file    Inability: Not on file  . Transportation needs:    Medical: Not on file    Non-medical: Not on file  Tobacco Use  . Smoking status: Former Games developermoker  . Smokeless tobacco: Never Used  Substance and Sexual Activity  . Alcohol use: No  . Drug use: No  . Sexual activity: Not on file  Lifestyle  . Physical activity:    Days per week: Not on file    Minutes per  session: Not on file  . Stress: Not on file  Relationships  . Social connections:    Talks on phone: Not on file    Gets together: Not on file    Attends religious service: Not on file    Active member of club or organization: Not on file    Attends meetings of clubs or organizations: Not on file    Relationship status: Not on file  . Intimate partner violence:    Fear of current or ex partner: Not on file    Emotionally abused: Not on file    Physically abused: Not on file    Forced sexual activity: Not on file  Other Topics Concern  . 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  Lives with her daughter.   Former smoker,  Uses non nicotine vape now.      Family History  Problem Relation Age of Onset  . Heart disease Father        valvuloplasty  . Hypertension Mother   . Heart disease Mother        died at 3873 with angioplasty complication  . Hypertension Sister   . Other  Sister        complications of HIV  . Hypertension Sister   . Hypertension Sister      Current Outpatient Medications:  .  ALPRAZolam (XANAX) 0.25 MG tablet, Take 1 tablet (0.25 mg total) by mouth at bedtime as needed for anxiety., Disp: 90 tablet, Rfl: 0 .  aspirin EC 81 MG tablet, Take 1 tablet (81 mg total) by mouth daily., Disp: 90 tablet, Rfl: 3 .  atenolol (TENORMIN) 50 MG tablet, Take 1 tablet (50 mg total) by mouth daily., Disp: 90 tablet, Rfl: 3 .  buPROPion (WELLBUTRIN XL) 150 MG 24 hr tablet, TAKE 1 TABLET BY MOUTH ONCE DAILY, Disp: 90 tablet, Rfl: 0 .  co-enzyme Q-10 50 MG capsule, Take 1 capsule (50 mg total) by mouth daily., Disp: 90 capsule, Rfl: 3 .  diltiazem (CARTIA XT) 300 MG 24 hr capsule, Take 1 capsule (300 mg total) by mouth daily., Disp: 90 capsule, Rfl: 3 .  ergocalciferol (VITAMIN D2) 50000 units capsule, Take 1 capsule (50,000 Units total) by mouth once a week., Disp: 12 capsule, Rfl: 3 .   fish oil-omega-3 fatty acids 1000 MG capsule, Take 1 g by mouth daily.  , Disp: , Rfl:  .  losartan-hydrochlorothiazide (HYZAAR) 100-25 MG tablet, Take 1 tablet by mouth daily., Disp: 90 tablet, Rfl: 0 .  rosuvastatin (CRESTOR) 20 MG tablet, TAKE 1 TABLET BY MOUTH AT BEDTIME, Disp: 90 tablet, Rfl: 0 .  Saxagliptin-Metformin (KOMBIGLYZE XR) 5-500 MG TB24, Take 1 tablet by mouth daily., Disp: 90 tablet, Rfl: 3 .  sertraline (ZOLOFT) 100 MG tablet, Take 0.5 tablets (50 mg total) by mouth daily., Disp: 90 tablet, Rfl: 1 .  vitamin E 200 UNIT capsule, Take 200 Units by mouth daily., Disp: , Rfl:   Allergies  Allergen Reactions  . Janumet Xr [Sitagliptin-Metformin Hcl Er]     Bad headaches  . Metformin And Related     Nausea, anorexia  . Nuprin [Ibuprofen] Hives    Can tolerate generic Ibuprofen    Review of Systems Constitutional: -fever, -chills, -sweats, -unexpected weight change, -decreased appetite, -fatigue Allergy: -sneezing, -itching, -congestion Dermatology: -changing moles, --rash, -lumps ENT: -runny nose, -ear pain, -sore throat, -hoarseness, -sinus pain, -teeth pain, - ringing in ears, -hearing loss, -nosebleeds Cardiology: -chest pain, -palpitations, -swelling, -difficulty breathing when lying flat, -waking up short of breath Respiratory: -cough, -shortness of breath, -difficulty breathing with exercise or exertion, -wheezing, -coughing up blood Gastroenterology: -abdominal pain, -nausea, -vomiting, -diarrhea, -constipation, -blood in stool, -changes in bowel movement, -difficulty swallowing or eating Hematology: -bleeding, -bruising  Musculoskeletal: +right knee pains, otherwise -joint aches, -muscle aches, -joint swelling, -back pain, -neck pain, -cramping, -changes in gait Ophthalmology: denies vision changes, eye redness, itching, discharge Urology: -burning with urination, -difficulty urinating, -blood in urine, -urinary frequency, -urgency, -incontinence Neurology:  -headache, -weakness, -tingling, -numbness, -memory loss, -falls, -dizziness Psychology: -depressed mood, -agitation, -sleep problems     Objective:   Physical Exam BP 132/80   Pulse (!) 56   Temp 97.9 F (36.6 C) (Oral)   Resp 16   Ht 5' 6.5" (1.689 m)   Wt 184 lb 12.8 oz (83.8 kg)   LMP  (LMP Unknown)   SpO2 97%   BMI 29.38 kg/m   Wt Readings from Last 3 Encounters:  08/06/17 184 lb 12.8 oz (83.8 kg)  03/16/17 187 lb 12.8 oz (85.2 kg)  10/30/16 184 lb (83.5 kg)   BP Readings from Last 3 Encounters:  08/06/17 132/80  03/16/17 122/80  10/30/16 120/68   General appearance: alert, no distress, WD/WN, AA female Skin:right cheek faint boomerang shaped scar, few scattered macules, no worrisome lesions HEENT: normocephalic, conjunctiva/corneas normal, sclerae anicteric, PERRLA, EOMi, nares patent, no discharge or erythema, pharynx normal Oral cavity: MMM, tongue normal, teeth - dentures present Neck: supple, no lymphadenopathy, no thyromegaly, no masses, normal ROM, no bruits Chest: non tender, normal shape and expansion Heart: RRR, normal S1, S2, no murmurs Lungs: CTA bilaterally, no wheezes, rhonchi, or rales Abdomen: +bs, soft, non tender, non distended, no masses, no hepatomegaly, no splenomegaly, no bruits Back: non tender, normal ROM, no scoliosis Musculoskeletal: upper extremities non tender, no obvious deformity, normal ROM throughout +mildly tender over right knee joint line with palpation, mild pain with Mcmurray but no pop, otherwise lower extremities non tender, no obvious deformity, normal ROM throughout Extremities: no edema, no cyanosis, no clubbing Pulses: 2+ symmetric, upper and lower extremities, normal cap refill Neurological: alert, oriented x 3, CN2-12 intact, strength normal upper extremities and lower extremities, sensation normal throughout, DTRs 2+ throughout, no cerebellar signs, gait normal Psychiatric: normal affect, behavior normal, pleasant   Breast/gyn/rectal - deferred to gyn   Adult ECG Report  Indication: physical, risk factors  Rate: 73 bpm  Rhythm: normal sinus rhythm  QRS Axis: 0 degrees  PR Interval:  QRS Duration: 80ms  QTc:  Conduction Disturbances: Q in III  Other Abnormalities: none  Patient's cardiac risk factors are: diabetes mellitus, dyslipidemia, hypertension and sedentary lifestyle.  EKG comparison: no change from 2014 EKG  Narrative Interpretation: no acute change, possible prior septal infarct   Assessment and Plan :    Encounter Diagnoses  Name Primary?  . Essential hypertension Yes  . Medicare annual wellness visit, initial   . Diabetes mellitus with complication (HCC)   . Osteopenia, unspecified location   . Vitamin D deficiency   . Vaccine counseling   . Depression, major, in remission (HCC)   . Dyslipidemia    Physical exam - discussed healthy lifestyle, diet, exercise, preventative care, vaccinations, and addressed their concerns.  Handout given. Referral for bone density screen Referral for cologuard testing since she declines colonoscopy, low risk advised she stop vaping as well.  Glad she stopped smoking 4 years ago. Discussed need for regular exercise, healthy diet. Diabetes type 2 - will call back with labs and recommendations HTN - will need adjustment of her medications pending labs dyslipidemia - we will call with recommendations pending labs Depression in remission and anxiety - c/t current medications Right knee pain - will consider xray going forward Follow-up pending labs

## 2017-08-07 ENCOUNTER — Other Ambulatory Visit: Payer: Self-pay | Admitting: Medical

## 2017-08-07 DIAGNOSIS — I10 Essential (primary) hypertension: Secondary | ICD-10-CM

## 2017-08-07 LAB — BASIC METABOLIC PANEL
BUN/Creatinine Ratio: 12 (ref 12–28)
BUN: 12 mg/dL (ref 8–27)
CALCIUM: 9.7 mg/dL (ref 8.7–10.3)
CO2: 22 mmol/L (ref 20–29)
CREATININE: 0.99 mg/dL (ref 0.57–1.00)
Chloride: 103 mmol/L (ref 96–106)
GFR calc Af Amer: 68 mL/min/{1.73_m2} (ref >59)
GFR calc non Af Amer: 59 mL/min/{1.73_m2} — ABNORMAL LOW (ref >59)
Glucose: 92 mg/dL (ref 65–99)
Potassium: 4.5 mmol/L (ref 3.5–5.2)
Sodium: 141 mmol/L (ref 134–144)

## 2017-08-07 LAB — CBC
HEMATOCRIT: 37.5 % (ref 34.0–46.6)
HEMOGLOBIN: 12.3 g/dL (ref 11.1–15.9)
MCH: 29.1 pg (ref 26.6–33.0)
MCHC: 32.8 g/dL (ref 31.5–35.7)
MCV: 89 fL (ref 79–97)
PLATELETS: 259 10*3/uL (ref 150–450)
RBC: 4.23 x10E6/uL (ref 3.77–5.28)
RDW: 14.9 % (ref 12.3–15.4)
WBC: 8.6 10*3/uL (ref 3.4–10.8)

## 2017-08-07 LAB — HEMOGLOBIN A1C
Est. average glucose Bld gHb Est-mCnc: 148 mg/dL
Hgb A1c MFr Bld: 6.8 % — ABNORMAL HIGH (ref 4.8–5.6)

## 2017-08-07 LAB — VITAMIN D 25 HYDROXY (VIT D DEFICIENCY, FRACTURES): Vit D, 25-Hydroxy: 48.8 ng/mL (ref 30.0–100.0)

## 2017-08-07 MED ORDER — SAXAGLIPTIN-METFORMIN ER 5-500 MG PO TB24: 1.0000 | ORAL_TABLET | Freq: Every day | ORAL | 3 refills | Status: DC

## 2017-08-07 MED ORDER — LOSARTAN POTASSIUM-HCTZ 100-25 MG PO TABS: 1.0000 | ORAL_TABLET | Freq: Every day | ORAL | 3 refills | Status: DC

## 2017-08-07 MED ORDER — ROSUVASTATIN CALCIUM 20 MG PO TABS: 20.0000 mg | ORAL_TABLET | Freq: Every day | ORAL | 3 refills | Status: DC

## 2017-08-07 MED ORDER — ASPIRIN EC 81 MG PO TBEC: 81.0000 mg | DELAYED_RELEASE_TABLET | Freq: Every day | ORAL | 3 refills | Status: DC

## 2017-08-07 MED ORDER — ERGOCALCIFEROL 1.25 MG (50000 UT) PO CAPS: 50000.0000 [IU] | ORAL_CAPSULE | ORAL | 3 refills | Status: DC

## 2017-08-07 MED ORDER — ATENOLOL 50 MG PO TABS: 50.0000 mg | ORAL_TABLET | Freq: Every day | ORAL | 3 refills | Status: DC

## 2017-08-07 MED ORDER — DILTIAZEM HCL ER COATED BEADS 300 MG PO CP24: 300.0000 mg | ORAL_CAPSULE | Freq: Every day | ORAL | 3 refills | Status: DC

## 2017-08-07 MED ORDER — SERTRALINE HCL 100 MG PO TABS: 50.0000 mg | ORAL_TABLET | Freq: Every day | ORAL | 3 refills | Status: DC

## 2017-08-07 MED ORDER — BUPROPION HCL ER (XL) 150 MG PO TB24: 150.0000 mg | ORAL_TABLET | Freq: Every day | ORAL | 3 refills | Status: DC

## 2017-08-07 NOTE — Progress Notes (Signed)
Med refills

## 2017-08-11 ENCOUNTER — Other Ambulatory Visit: Payer: Self-pay | Admitting: Medical

## 2017-08-13 NOTE — Telephone Encounter (Signed)
Is this ok to refill?  

## 2017-09-10 ENCOUNTER — Other Ambulatory Visit: Payer: Self-pay | Admitting: Medical

## 2017-09-10 DIAGNOSIS — Z1231 Encounter for screening mammogram for malignant neoplasm of breast: Secondary | ICD-10-CM

## 2017-09-11 ENCOUNTER — Other Ambulatory Visit: Payer: Self-pay | Admitting: Medical

## 2017-11-26 ENCOUNTER — Ambulatory Visit
Admission: RE | Admit: 2017-11-26 | Discharge: 2017-11-26 | Disposition: A | Discharge status: Home / Self Care | Payer: Medicare Other | Source: Ambulatory Visit | Attending: Medical | Admitting: Medical

## 2017-11-26 DIAGNOSIS — M8580 Other specified disorders of bone density and structure, unspecified site: Secondary | ICD-10-CM

## 2017-11-26 DIAGNOSIS — E559 Vitamin D deficiency, unspecified: Secondary | ICD-10-CM

## 2017-11-26 DIAGNOSIS — Z780 Asymptomatic menopausal state: Secondary | ICD-10-CM | POA: Diagnosis not present

## 2017-11-26 DIAGNOSIS — E2839 Other primary ovarian failure: Secondary | ICD-10-CM

## 2017-11-26 DIAGNOSIS — Z1231 Encounter for screening mammogram for malignant neoplasm of breast: Secondary | ICD-10-CM

## 2017-11-26 DIAGNOSIS — Z13820 Encounter for screening for osteoporosis: Secondary | ICD-10-CM | POA: Diagnosis not present

## 2017-11-28 ENCOUNTER — Encounter: Payer: Self-pay | Admitting: Internal Medicine

## 2017-11-28 ENCOUNTER — Other Ambulatory Visit: Payer: Medicare Other

## 2017-12-05 ENCOUNTER — Ambulatory Visit (INDEPENDENT_AMBULATORY_CARE_PROVIDER_SITE_OTHER): Payer: Medicare Other | Admitting: Medical

## 2017-12-05 ENCOUNTER — Encounter: Payer: Self-pay | Admitting: Medical

## 2017-12-05 VITALS — BP 110/76 | HR 52 | Temp 98.4°F | Resp 16 | Ht 66.5 in | Wt 192.4 lb

## 2017-12-05 DIAGNOSIS — E785 Hyperlipidemia, unspecified: Secondary | ICD-10-CM

## 2017-12-05 DIAGNOSIS — Z23 Encounter for immunization: Secondary | ICD-10-CM | POA: Diagnosis not present

## 2017-12-05 DIAGNOSIS — E559 Vitamin D deficiency, unspecified: Secondary | ICD-10-CM

## 2017-12-05 DIAGNOSIS — E118 Type 2 diabetes mellitus with unspecified complications: Secondary | ICD-10-CM

## 2017-12-05 DIAGNOSIS — I10 Essential (primary) hypertension: Secondary | ICD-10-CM

## 2017-12-05 LAB — POCT GLYCOSYLATED HEMOGLOBIN (HGB A1C): Hemoglobin A1C: 8.4 % — AB (ref 4.0–5.6)

## 2017-12-05 MED ORDER — OSELTAMIVIR PHOSPHATE 75 MG PO CAPS: 75.0000 mg | ORAL_CAPSULE | Freq: Two times a day (BID) | ORAL | 0 refills | Status: DC

## 2017-12-05 MED ORDER — VITAMIN D 1000 UNITS PO TABS: 1000.0000 [IU] | ORAL_TABLET | Freq: Every day | ORAL | 3 refills | Status: DC

## 2017-12-05 MED ORDER — BENZONATATE 200 MG PO CAPS: 200.0000 mg | ORAL_CAPSULE | Freq: Three times a day (TID) | ORAL | 0 refills | Status: DC | PRN

## 2017-12-05 NOTE — Progress Notes (Signed)
Subjective: Chief Complaint  Patient presents with  . DM check    DM htn check    Here for med check and follow-up.  Last visit in June.  She is compliant with all medications, and last visit we had recommended several screening and follow-up issues  She had her bone density scan, her mammogram, she did unfortunately gained a little weight.  She has no new symptoms.  She is due for Pap smear, will be seeing a new provider at the office of Dr. Francoise Ceo who retired   Wants flu shot today  Sees her eye doctor in the same   Past Medical History:  Diagnosis Date  . Depression   . Diabetes mellitus   . Former smoker   . Hyperlipidemia   . Hypertension   . Osteoporosis    OSTEOPENIA  . Routine gynecological examination    Dr. Gaynell Face  . Vitamin D deficiency   . Wears dentures   . Wears glasses    Current Outpatient Medications on File Prior to Visit  Medication Sig Dispense Refill  . ALPRAZolam (XANAX) 0.25 MG tablet Take 1 tablet (0.25 mg total) by mouth at bedtime as needed for anxiety. 90 tablet 0  . aspirin EC 81 MG tablet Take 1 tablet (81 mg total) by mouth daily. 90 tablet 3  . atenolol (TENORMIN) 50 MG tablet Take 1 tablet (50 mg total) by mouth daily. 90 tablet 3  . buPROPion (WELLBUTRIN XL) 150 MG 24 hr tablet Take 1 tablet (150 mg total) by mouth daily. 90 tablet 3  . co-enzyme Q-10 50 MG capsule Take 1 capsule (50 mg total) by mouth daily. 90 capsule 3  . diltiazem (CARTIA XT) 300 MG 24 hr capsule Take 1 capsule (300 mg total) by mouth daily. 90 capsule 3  . ergocalciferol (VITAMIN D2) 50000 units capsule Take 1 capsule (50,000 Units total) by mouth once a week. (Patient taking differently: Take 1,000 Units by mouth once a week. ) 12 capsule 3  . fish oil-omega-3 fatty acids 1000 MG capsule Take 1 g by mouth daily.      Marland Kitchen losartan-hydrochlorothiazide (HYZAAR) 100-25 MG tablet Take 1 tablet by mouth daily. 90 tablet 3  . rosuvastatin (CRESTOR) 20 MG tablet Take  1 tablet (20 mg total) by mouth at bedtime. 90 tablet 3  . Saxagliptin-Metformin (KOMBIGLYZE XR) 5-500 MG TB24 Take 1 tablet by mouth daily. 90 tablet 3  . sertraline (ZOLOFT) 100 MG tablet Take 0.5 tablets (50 mg total) by mouth daily. 90 tablet 3  . vitamin E 200 UNIT capsule Take 200 Units by mouth daily.     No current facility-administered medications on file prior to visit.    ROS as in subjective   Objective: BP 110/76   Pulse (!) 52   Temp 98.4 F (36.9 C) (Oral)   Resp 16   Ht 5' 6.5" (1.689 m)   Wt 192 lb 6.4 oz (87.3 kg)   LMP  (LMP Unknown)   SpO2 94%   BMI 30.59 kg/m    General appearance: alert, no distress, WD/WN,  Neck: supple, no lymphadenopathy, no thyromegaly, no masses Heart: RRR, normal S1, S2, no murmurs Lungs: CTA bilaterally, no wheezes, rhonchi, or rales Pulses: 2+ symmetric, upper and lower extremities, normal cap refill Ext: no edema    Assessment: Encounter Diagnoses  Name Primary?  . Diabetes mellitus with complication (HCC) Yes  . Need for immunization against influenza   . Essential hypertension   .  Dyslipidemia   . Vitamin D deficiency      Plan: Reviewed her recent bone density scan which was actually normal.  Reviewed her recent mammogram.  Advised her to go ahead and get her Pap smear as planned.  See your eye doctor in December as planned.  She is still on the fence about doing Cologuard, absolutely refuses colonoscopy  Diabetes - HgbA1C today >8%.  She declines medication changes today so we will continue same medicine but she promises to work on diet changes.  She notes she has been eating a lot worse in the past few months and has gained almost 10 pounds.  Not exercising as much.  HTN - c/t same medication  Dyslipidemia - c/t same medication  Vit D deficiency - change to 1000 Vit D daily  Counseled on the influenza virus vaccine.  Vaccine information sheet given.   High dose Influenza vaccine given after consent  obtained.  Sunshine was seen today for dm check.  Diagnoses and all orders for this visit:  Diabetes mellitus with complication (HCC)  Need for immunization against influenza -     Flu vaccine HIGH DOSE PF (Fluzone High Dose)  Essential hypertension  Dyslipidemia  Vitamin D deficiency

## 2017-12-19 ENCOUNTER — Other Ambulatory Visit: Payer: Self-pay | Admitting: Medical

## 2017-12-19 NOTE — Telephone Encounter (Signed)
Is this ok to refill?  

## 2018-01-12 ENCOUNTER — Other Ambulatory Visit: Payer: Self-pay | Admitting: Medical

## 2018-01-12 DIAGNOSIS — I10 Essential (primary) hypertension: Secondary | ICD-10-CM

## 2018-01-18 ENCOUNTER — Ambulatory Visit: Payer: Medicare (Managed Care) | Admitting: Obstetrics

## 2018-01-29 DIAGNOSIS — I10 Essential (primary) hypertension: Secondary | ICD-10-CM | POA: Diagnosis not present

## 2018-01-29 DIAGNOSIS — E119 Type 2 diabetes mellitus without complications: Secondary | ICD-10-CM | POA: Diagnosis not present

## 2018-01-29 DIAGNOSIS — H2513 Age-related nuclear cataract, bilateral: Secondary | ICD-10-CM | POA: Diagnosis not present

## 2018-01-29 DIAGNOSIS — H35033 Hypertensive retinopathy, bilateral: Secondary | ICD-10-CM | POA: Diagnosis not present

## 2018-02-01 ENCOUNTER — Encounter: Payer: Self-pay | Admitting: Obstetrics

## 2018-02-01 ENCOUNTER — Ambulatory Visit (INDEPENDENT_AMBULATORY_CARE_PROVIDER_SITE_OTHER): Payer: Medicare Other | Admitting: Obstetrics

## 2018-02-01 ENCOUNTER — Other Ambulatory Visit (HOSPITAL_COMMUNITY)
Admission: RE | Admit: 2018-02-01 | Discharge: 2018-02-01 | Disposition: A | Discharge status: Home / Self Care | Payer: Medicare Other | Source: Ambulatory Visit | Attending: Obstetrics | Admitting: Obstetrics

## 2018-02-01 VITALS — BP 165/83 | HR 65 | Ht 66.0 in | Wt 183.8 lb

## 2018-02-01 DIAGNOSIS — Z124 Encounter for screening for malignant neoplasm of cervix: Secondary | ICD-10-CM

## 2018-02-01 DIAGNOSIS — N898 Other specified noninflammatory disorders of vagina: Secondary | ICD-10-CM | POA: Insufficient documentation

## 2018-02-01 DIAGNOSIS — Z01419 Encounter for gynecological examination (general) (routine) without abnormal findings: Secondary | ICD-10-CM | POA: Insufficient documentation

## 2018-02-01 NOTE — Progress Notes (Signed)
Subjective:        Brenda [name] is a 67 y.o. female here for a routine exam.  Current complaints: None.    Personal health questionnaire:  Is patient Ashkenazi Jewish, have a family history of breast and/or ovarian cancer: no Is there a family history of uterine cancer diagnosed at age < 6250, gastrointestinal cancer, urinary tract cancer, family member who is a Personnel officerLynch syndrome-associated carrier: no Is the patient overweight and hypertensive, family history of diabetes, personal history of gestational diabetes, preeclampsia or PCOS: no Is patient over 2855, have PCOS,  family history of premature CHD under age 68, diabetes, smoke, have hypertension or peripheral artery disease:  no At any time, has a partner hit, kicked or otherwise hurt or frightened you?: no Over the past 2 weeks, have you felt down, depressed or hopeless?: no Over the past 2 weeks, have you felt little interest or pleasure in doing things?:no   Gynecologic History No LMP recorded (lmp unknown). Patient is postmenopausal. Contraception: post menopausal status Last Pap: 2017. Results were: normal Last mammogram: 2019. Results were: normal  Obstetric History OB History  Gravida Para Term Preterm AB Living  3         3  SAB TAB Ectopic Multiple Live Births          3    # Outcome Date GA Lbr Len/2nd Weight Sex Delivery Anes PTL Lv  3 Gravida           2 Gravida           1 [place]Gravida             Past Medical History:  Diagnosis Date  . Depression   . Diabetes mellitus   . Former smoker   . Hyperlipidemia   . Hypertension   . Osteoporosis    OSTEOPENIA  . Routine gynecological examination    Dr. Gaynell FaceMarshall  . Vitamin D deficiency   . Wears dentures   . Wears glasses     Past Surgical History:  Procedure Laterality Date  . CESAREAN SECTION  04/12/1983  . COLONOSCOPY  2006   declines further colonoscopy     Current Outpatient Medications:  .  ALPRAZolam (XANAX) 0.25 MG tablet, Take 1 tablet  (0.25 mg total) by mouth at bedtime as needed for anxiety., Disp: 90 tablet, Rfl: 0 .  aspirin EC 81 MG tablet, Take 1 tablet (81 mg total) by mouth daily., Disp: 90 tablet, Rfl: 3 .  atenolol (TENORMIN) 50 MG tablet, TAKE 1 TABLET BY MOUTH ONCE DAILY, Disp: 90 tablet, Rfl: 1 .  buPROPion (WELLBUTRIN XL) 150 MG 24 hr tablet, Take 1 tablet (150 mg total) by mouth daily., Disp: 90 tablet, Rfl: 3 .  cholecalciferol (VITAMIN D) 1000 units tablet, Take 1 tablet (1,000 Units total) by mouth daily., Disp: 90 tablet, Rfl: 3 .  co-enzyme Q-10 50 MG capsule, Take 1 capsule (50 mg total) by mouth daily., Disp: 90 capsule, Rfl: 3 .  diltiazem (CARTIA XT) 300 MG 24 hr capsule, Take 1 capsule (300 mg total) by mouth daily., Disp: 90 capsule, Rfl: 3 .  fish oil-omega-3 fatty acids 1000 MG capsule, Take 1 g by mouth daily.  , Disp: , Rfl:  .  KOMBIGLYZE XR 5-500 MG TB24, TAKE 1 TABLET BY MOUTH ONCE DAILY, Disp: 90 tablet, Rfl: 3 .  losartan-hydrochlorothiazide (HYZAAR) 100-25 MG tablet, Take 1 tablet by mouth daily., Disp: 90 tablet, Rfl: 3 .  rosuvastatin (CRESTOR) 20 MG  tablet, Take 1 tablet (20 mg total) by mouth at bedtime., Disp: 90 tablet, Rfl: 3 .  sertraline (ZOLOFT) 100 MG tablet, Take 0.5 tablets (50 mg total) by mouth daily., Disp: 90 tablet, Rfl: 3 .  vitamin E 200 UNIT capsule, Take 200 Units by mouth daily., Disp: , Rfl:  Allergies  Allergen Reactions  . Janumet Xr [Sitagliptin-Metformin Hcl Er]     Bad headaches  . Metformin And Related     Nausea, anorexia  . Nuprin [Ibuprofen] Hives    Can tolerate generic Ibuprofen    Social History   Tobacco Use  . Smoking status: Former Games developermoker  . Smokeless tobacco: Never Used  Substance Use Topics  . Alcohol use: No    Family History  Problem Relation Age of Onset  . Heart disease Father        valvuloplasty  . Hypertension Mother   . Heart disease Mother        died at 9073 with angioplasty complication  . Hypertension Sister   . Other Sister         complications of HIV  . Hypertension Sister   . Hypertension Sister       Review of Systems  Constitutional: negative for fatigue and weight loss Respiratory: negative for cough and wheezing Cardiovascular: negative for chest pain, fatigue and palpitations Gastrointestinal: negative for abdominal pain and change in bowel habits Musculoskeletal:negative for myalgias Neurological: negative for gait problems and tremors Behavioral/Psych: negative for abusive relationship, depression Endocrine: negative for temperature intolerance    Genitourinary:negative for abnormal menstrual periods, genital lesions, hot flashes, sexual problems and vaginal discharge Integument/breast: negative for breast lump, breast tenderness, nipple discharge and skin lesion(s)    Objective:       BP (!) 165/83   Pulse 65   Ht 5\' 6"  (1.676 m)   Wt 183 lb 12.8 oz (83.4 kg)   LMP  (LMP Unknown)   BMI 29.67 kg/m  General:   alert  Skin:   no rash or abnormalities  Lungs:   clear to auscultation bilaterally  Heart:   regular rate and rhythm, S1, S2 normal, no murmur, click, rub or gallop  Breasts:   normal without suspicious masses, skin or nipple changes or axillary nodes  Abdomen:  normal findings: no organomegaly, soft, non-tender and no hernia  Pelvis:  External genitalia: normal general appearance Urinary system: urethral meatus normal and bladder without fullness, nontender Vaginal: normal without tenderness, induration or masses Cervix: normal appearance Adnexa: normal bimanual exam Uterus: anteverted and non-tender, normal size   Lab Review Urine pregnancy test Labs reviewed yes Radiologic studies reviewed yes  50% of 20 min visit spent on counseling and coordination of care.   Assessment:     1. Encounter for routine gynecological examination with Papanicolaou smear of cervix Rx: - Cytology - PAP( )  2. Vaginal discharge Rx: - Cervicovaginal ancillary only( [facility])    Plan:    Education reviewed: calcium supplements, depression evaluation, low fat, low cholesterol diet, safe sex/STD prevention, self breast exams and weight bearing exercise. Follow up in: 2 years.   No orders of the defined types were placed in this encounter.  No orders of the defined types were placed in this encounter.   Brock BadHARLES A. HARPER MD 02-01-2018

## 2018-02-01 NOTE — Progress Notes (Signed)
Pt presents for annual and has question about when is her last pap.  Normal MGM and Normal Bone Denstity 11/2017 Colonoscopy is UTD per pt

## 2018-02-04 LAB — CERVICOVAGINAL ANCILLARY ONLY
Bacterial vaginitis: POSITIVE — AB
CHLAMYDIA, DNA PROBE: NEGATIVE
Candida vaginitis: NEGATIVE
Neisseria Gonorrhea: NEGATIVE
Trichomonas: NEGATIVE

## 2018-02-05 ENCOUNTER — Other Ambulatory Visit: Payer: Self-pay | Admitting: Obstetrics

## 2018-02-05 DIAGNOSIS — N760 Acute vaginitis: Principal | ICD-10-CM

## 2018-02-05 DIAGNOSIS — B9689 Other specified bacterial agents as the cause of diseases classified elsewhere: Secondary | ICD-10-CM

## 2018-02-05 LAB — CYTOLOGY - PAP
Diagnosis: NEGATIVE
HPV 16/18/45 GENOTYPING: NEGATIVE
HPV: DETECTED — AB

## 2018-02-05 MED ORDER — TINIDAZOLE 500 MG PO TABS: 1000.0000 mg | ORAL_TABLET | Freq: Every day | ORAL | 2 refills | Status: DC

## 2018-04-15 ENCOUNTER — Encounter: Payer: Self-pay | Admitting: Medical

## 2018-04-15 ENCOUNTER — Ambulatory Visit (INDEPENDENT_AMBULATORY_CARE_PROVIDER_SITE_OTHER): Payer: Medicare Other | Admitting: Medical

## 2018-04-15 VITALS — BP 140/80 | HR 65 | Temp 98.3°F | Resp 16 | Ht 66.0 in | Wt 181.2 lb

## 2018-04-15 DIAGNOSIS — E118 Type 2 diabetes mellitus with unspecified complications: Secondary | ICD-10-CM

## 2018-04-15 DIAGNOSIS — R0989 Other specified symptoms and signs involving the circulatory and respiratory systems: Secondary | ICD-10-CM | POA: Insufficient documentation

## 2018-04-15 DIAGNOSIS — F411 Generalized anxiety disorder: Secondary | ICD-10-CM | POA: Diagnosis not present

## 2018-04-15 DIAGNOSIS — I10 Essential (primary) hypertension: Secondary | ICD-10-CM | POA: Diagnosis not present

## 2018-04-15 DIAGNOSIS — E785 Hyperlipidemia, unspecified: Secondary | ICD-10-CM | POA: Diagnosis not present

## 2018-04-15 DIAGNOSIS — L84 Corns and callosities: Secondary | ICD-10-CM | POA: Diagnosis not present

## 2018-04-15 NOTE — Patient Instructions (Signed)
Recommendations: Shingles vaccine:  I recommend you have a shingles vaccine to help prevent shingles or herpes zoster outbreak.   Please call your insurer to inquire about coverage for the Shingrix vaccine given in 2 doses.   Some insurers cover this vaccine after age 69, some cover this after age 67.  If your insurer covers this, then call to schedule appointment to have this vaccine here.  Please call your insurance and vascular center about out of pocket deductible or copay.  I would like you to have an updated blood flow screen in your legs called ABI, ankle brachial index.  This is recommended for diabetics with decreased pulses in the feet.  Vascular center [phone]  Continue regular exercise, healthy diet, and continue current medications

## 2018-04-15 NOTE — Progress Notes (Signed)
Subjective:     Patient ID: Brenda [name], female   DOB: 21-Jul-1950, 68 y.o.   MRN: [id number]  HPI Chief Complaint  Patient presents with  . follow up    BP follow up and DM  fasting    Here for med check.   Walking a lot more for exercise.     Retired in 01/2018.  Daughter 49yo has been in [facility] with myelodysplasia, on her second stem cell transplant.    Hypertension-compliant with atenolol 50 mg daily, losartan HCT 100/25 mg daily, diltiazem XT 300 mg daily.   BPs doing good, most of the time normal.  Diabetes-compliant with Kombiglyze Exar 5/500 mg daily, fasting been running 140s, but hours later in the low 100s.  No foot concerns.    Hyperlipidemia-compliant with rosuvastatin 20 mg daily, aspirin 81 mg daily  No other aggravating or relieving factors. No other complaint.  Review of Systems     Objective:   Physical Exam BP 140/80   Pulse 65   Temp 98.3 F (36.8 C) (Oral)   Resp 16   Ht 5\' 6"  (1.676 m)   Wt 181 lb 3.2 oz (82.2 kg)   LMP  (LMP Unknown)   SpO2 98%   BMI 29.25 kg/m   BP Readings from Last 3 Encounters:  04/15/18 140/80  02/01/18 (!) 165/83  12/05/17 110/76   Wt Readings from Last 3 Encounters:  04/15/18 181 lb 3.2 oz (82.2 kg)  02/01/18 183 lb 12.8 oz (83.4 kg)  12/05/17 192 lb 6.4 oz (87.3 kg)   General appearance: alert, no distress, WD/WN, AA female Oral cavity: MMM, no lesions Neck: supple, no lymphadenopathy, no thyromegaly, no masses Heart: RRR, normal S1, S2, no murmurs Lungs: CTA bilaterally, no wheezes, rhonchi, or rales Pulses: 2+ symmetric, upper and lower extremities, normal cap refill Ext: no edema   Diabetic Foot Exam - Simple   Simple Foot Form Diabetic Foot exam was performed with the following findings:  Yes 04/15/2018  2:05 PM  Visual Inspection See comments:  Yes Sensation Testing Intact to touch and monofilament testing bilaterally:  Yes Pulse Check See comments:  Yes Comments 1+ pedal pulses, good  cap refill, callous bilat 5th toe dorsolaterally distal phalanx, no obvious wound or sore, mildly thickened nails in general         Assessment:     Encounter Diagnoses  Name Primary?  . Essential hypertension Yes  . Diabetes mellitus with complication (HCC)   . Dyslipidemia   . Generalized anxiety disorder   . Decreased pedal pulses   . Callus        Plan:     Hyperlipidemia-reviewed last lipids from 03/2017 which showed LDL 75, cholesterol to HDL ratio 2.7, triglycerides 86, HDL 53.  Labs today, c/t same medication  Hypertension - home readings good, c/t same medication  Diabetes - labs today, c/t foot care, healthy diet, exercise, and f/u pending labs  GAD - stable, no concerns  Callus - discussed foot soaks, pumice stone to callus, c/t routine foot care  Decreased pulses - advised ABIs.  She wants to call and check copay and insurance first.  Declines today  Shingles vaccine:  I recommend you have a shingles vaccine to help prevent shingles or herpes zoster outbreak.   Please call your insurer to inquire about coverage for the Shingrix vaccine given in 2 doses.   Some insurers cover this vaccine after age 36, some cover this after age 12.  If  your insurer covers this, then call to schedule appointment to have this vaccine here.   Brenda [name] was seen today for follow up.  Diagnoses and all orders for this visit:  Essential hypertension -     Comprehensive metabolic panel -     Lipid panel -     Hemoglobin A1c -     Microalbumin / creatinine urine ratio  Diabetes mellitus with complication (HCC) -     Comprehensive metabolic panel -     Lipid panel -     Hemoglobin A1c -     Microalbumin / creatinine urine ratio  Dyslipidemia  Generalized anxiety disorder  Decreased pedal pulses  Callus

## 2018-04-16 LAB — COMPREHENSIVE METABOLIC PANEL
A/G RATIO: 1.8 (ref 1.2–2.2)
ALBUMIN: 4.4 g/dL (ref 3.8–4.8)
ALT: 12 IU/L (ref 0–32)
AST: 21 IU/L (ref 0–40)
Alkaline Phosphatase: 76 IU/L (ref 39–117)
BILIRUBIN TOTAL: 0.3 mg/dL (ref 0.0–1.2)
BUN/Creatinine Ratio: 13 (ref 12–28)
BUN: 12 mg/dL (ref 8–27)
CALCIUM: 10 mg/dL (ref 8.7–10.3)
CO2: 20 mmol/L (ref 20–29)
Chloride: 101 mmol/L (ref 96–106)
Creatinine, Ser: 0.91 mg/dL (ref 0.57–1.00)
GFR calc non Af Amer: 65 mL/min/{1.73_m2} (ref >59)
GFR, EST AFRICAN AMERICAN: 75 mL/min/{1.73_m2} (ref >59)
GLOBULIN, TOTAL: 2.5 g/dL (ref 1.5–4.5)
Glucose: 76 mg/dL (ref 65–99)
POTASSIUM: 4 mmol/L (ref 3.5–5.2)
Sodium: 139 mmol/L (ref 134–144)
Total Protein: 6.9 g/dL (ref 6.0–8.5)

## 2018-04-16 LAB — MICROALBUMIN / CREATININE URINE RATIO: Creatinine, Urine: 46.3 mg/dL

## 2018-04-16 LAB — LIPID PANEL
CHOLESTEROL TOTAL: 145 mg/dL (ref 100–199)
Chol/HDL Ratio: 2.7 ratio (ref 0.0–4.4)
HDL: 53 mg/dL (ref >39)
LDL Calculated: 73 mg/dL (ref 0–99)
TRIGLYCERIDES: 97 mg/dL (ref 0–149)
VLDL CHOLESTEROL CAL: 19 mg/dL (ref 5–40)

## 2018-04-16 LAB — HEMOGLOBIN A1C
Est. average glucose Bld gHb Est-mCnc: 143 mg/dL
Hgb A1c MFr Bld: 6.6 % — ABNORMAL HIGH (ref 4.8–5.6)

## 2018-06-05 ENCOUNTER — Other Ambulatory Visit: Payer: Self-pay | Admitting: Medical

## 2018-08-10 ENCOUNTER — Other Ambulatory Visit: Payer: Self-pay | Admitting: Medical

## 2018-08-12 NOTE — Telephone Encounter (Signed)
Is this ok to refill?  

## 2018-09-02 ENCOUNTER — Other Ambulatory Visit: Payer: Self-pay | Admitting: Medical

## 2018-09-02 NOTE — Telephone Encounter (Signed)
Is this ok to refill?  

## 2018-09-16 ENCOUNTER — Other Ambulatory Visit: Payer: Self-pay

## 2018-10-07 ENCOUNTER — Ambulatory Visit (INDEPENDENT_AMBULATORY_CARE_PROVIDER_SITE_OTHER): Payer: Medicare Other | Admitting: Medical

## 2018-10-07 ENCOUNTER — Other Ambulatory Visit: Payer: Self-pay

## 2018-10-07 ENCOUNTER — Encounter: Payer: Self-pay | Admitting: Medical

## 2018-10-07 VITALS — BP 130/74 | HR 70 | Temp 98.4°F | Resp 16 | Ht 66.5 in | Wt 177.6 lb

## 2018-10-07 DIAGNOSIS — F411 Generalized anxiety disorder: Secondary | ICD-10-CM | POA: Diagnosis not present

## 2018-10-07 DIAGNOSIS — E559 Vitamin D deficiency, unspecified: Secondary | ICD-10-CM | POA: Diagnosis not present

## 2018-10-07 DIAGNOSIS — I10 Essential (primary) hypertension: Secondary | ICD-10-CM

## 2018-10-07 DIAGNOSIS — E785 Hyperlipidemia, unspecified: Secondary | ICD-10-CM

## 2018-10-07 DIAGNOSIS — E118 Type 2 diabetes mellitus with unspecified complications: Secondary | ICD-10-CM | POA: Diagnosis not present

## 2018-10-07 DIAGNOSIS — Z23 Encounter for immunization: Secondary | ICD-10-CM

## 2018-10-07 DIAGNOSIS — Z7189 Other specified counseling: Secondary | ICD-10-CM | POA: Diagnosis not present

## 2018-10-07 DIAGNOSIS — F325 Major depressive disorder, single episode, in full remission: Secondary | ICD-10-CM | POA: Diagnosis not present

## 2018-10-07 DIAGNOSIS — Z7185 Encounter for immunization safety counseling: Secondary | ICD-10-CM

## 2018-10-07 LAB — POCT GLYCOSYLATED HEMOGLOBIN (HGB A1C): Hemoglobin A1C: 6.4 % — AB (ref 4.0–5.6)

## 2018-10-07 MED ORDER — ATENOLOL 50 MG PO TABS: 50.0000 mg | ORAL_TABLET | Freq: Every day | ORAL | 3 refills | Status: DC

## 2018-10-07 MED ORDER — BUPROPION HCL ER (XL) 150 MG PO TB24: 150.0000 mg | ORAL_TABLET | Freq: Every day | ORAL | 3 refills | Status: DC

## 2018-10-07 MED ORDER — ASPIRIN 81 MG PO TBEC: 81.0000 mg | DELAYED_RELEASE_TABLET | Freq: Every day | ORAL | 3 refills | Status: DC

## 2018-10-07 MED ORDER — LOSARTAN POTASSIUM-HCTZ 100-25 MG PO TABS: 1.0000 | ORAL_TABLET | Freq: Every day | ORAL | 3 refills | Status: DC

## 2018-10-07 NOTE — Progress Notes (Signed)
Subjective: Chief Complaint  Patient presents with  . DM    DM follow up   Here for med check  Diabetes - checking sugars some.   Numbers usually good, 90-150 fasting.  Exercise - walking daily, 2 miles on weekend daily, 1 mi rest of the week.  Wants to lose 10 more pounds, has recently lost some weight.   No foot concerns, no polydipsia, no polyuria, doing well.    No chest pain, no dyspnea.  Compliant with medication for BP and cholesterol without c/o.  Is ok getting flu shot today.    No new c/o.     Past Medical History:  Diagnosis Date  . Depression   . Diabetes mellitus   . Former smoker   . Hyperlipidemia   . Hypertension   . Osteopenia    2016 bone density, normal appearing bone density 2019  . Routine gynecological examination    Dr. Ruthann Cancer  . Vitamin D deficiency   . Wears dentures   . Wears glasses    Current Outpatient Medications on File Prior to Visit  Medication Sig Dispense Refill  . cholecalciferol (VITAMIN D) 1000 units tablet Take 1 tablet (1,000 Units total) by mouth daily. 90 tablet 3  . co-enzyme Q-10 50 MG capsule Take 1 capsule (50 mg total) by mouth daily. 90 capsule 3  . diltiazem (CARDIZEM CD) 300 MG 24 hr capsule TAKE 1 CAPSULE(300 MG) BY MOUTH DAILY 90 capsule 0  . fish oil-omega-3 fatty acids 1000 MG capsule Take 1 g by mouth daily.      Marland Kitchen KOMBIGLYZE XR 5-500 MG TB24 TAKE 1 TABLET BY MOUTH ONCE DAILY 90 tablet 3  . rosuvastatin (CRESTOR) 20 MG tablet TAKE 1 TABLET(20 MG) BY MOUTH AT BEDTIME 90 tablet 3  . sertraline (ZOLOFT) 100 MG tablet TAKE HALF TABLET BY MOUTH EVERY DAY 90 tablet 1  . vitamin E 200 UNIT capsule Take 200 Units by mouth daily.     No current facility-administered medications on file prior to visit.      Objective: BP 130/74   Pulse 70   Temp 98.4 F (36.9 C) (Oral)   Resp 16   Ht 5' 6.5" (1.689 m)   Wt 177 lb 9.6 oz (80.6 kg)   LMP  (LMP Unknown)   SpO2 98%   BMI 28.24 kg/m   Wt Readings from Last 3  Encounters:  10/07/18 177 lb 9.6 oz (80.6 kg)  04/15/18 181 lb 3.2 oz (82.2 kg)  02/01/18 183 lb 12.8 oz (83.4 kg)   General appearance: alert, no distress, WD/WN,  Neck: supple, no lymphadenopathy, no thyromegaly, no masses Heart: RRR, normal S1, S2, no murmurs Lungs: CTA bilaterally, no wheezes, rhonchi, or rales Pulses: 2+ symmetric, upper extremities, normal cap refill Ext: no edema   Diabetic Foot Exam - Simple   Simple Foot Form Visual Inspection No deformities, no ulcerations, no other skin breakdown bilaterally: Yes Sensation Testing Intact to touch and monofilament testing bilaterally: Yes Pulse Check See comments: Yes Comments 1+ pedal pulses        Assessment: Encounter Diagnoses  Name Primary?  . Essential hypertension Yes  . Diabetes mellitus with complication ([place])   . Depression, major, in remission ([place])   . Dyslipidemia   . Generalized anxiety disorder   . Vaccine counseling   . Vitamin D deficiency   . Need for influenza vaccination       Plan: Diabetes-continue Kombiglyze XR 5/500 mg daily, c/t glucose monitoring, congratulated  on her weight loss and healthy diet.  HgbA1C 6.4% today.   Hypertension-continue diltiazem 300 mg daily, atenolol 50 mg daily, losartan HCT 100/25 mg daily  Vitamin D deficiency-continue vitamin D 1000 units daily  Depression in remission - Doing well on Wellbutrin 50 mg daily, Zoloft 100 mg, 1/2 tablet daily  Hyperlipidemia-continue rosuvastatin 20 mg daily and aspirin 81 mg daily along with coenzyme Q 10  Counseled on the influenza virus vaccine.  Vaccine information sheet given.   High dose Influenza vaccine given after consent obtained.   Elease Hashimotoatricia was seen today for dm.  Diagnoses and all orders for this visit:  Essential hypertension -     atenolol (TENORMIN) 50 MG tablet; Take 1 tablet (50 mg total) by mouth daily.  Diabetes mellitus with complication (HCC) -     HgB A1c  Depression, major, in  remission (HCC)  Dyslipidemia  Generalized anxiety disorder  Vaccine counseling  Vitamin D deficiency  Need for influenza vaccination  Other orders -     losartan-hydrochlorothiazide (HYZAAR) 100-25 MG tablet; Take 1 tablet by mouth daily. -     buPROPion (WELLBUTRIN XL) 150 MG 24 hr tablet; Take 1 tablet (150 mg total) by mouth daily. -     aspirin (ASPIRIN LOW DOSE) 81 MG EC tablet; Take 1 tablet (81 mg total) by mouth daily. Swallow whole.

## 2018-11-21 ENCOUNTER — Other Ambulatory Visit: Payer: Self-pay | Admitting: Medical

## 2018-11-21 DIAGNOSIS — Z1231 Encounter for screening mammogram for malignant neoplasm of breast: Secondary | ICD-10-CM

## 2019-01-07 ENCOUNTER — Ambulatory Visit: Payer: Medicare (Managed Care)

## 2019-01-07 ENCOUNTER — Ambulatory Visit
Admission: RE | Admit: 2019-01-07 | Discharge: 2019-01-07 | Disposition: A | Discharge status: Home / Self Care | Payer: Medicare Other | Source: Ambulatory Visit | Attending: Medical | Admitting: Medical

## 2019-01-07 ENCOUNTER — Other Ambulatory Visit: Payer: Self-pay

## 2019-01-07 DIAGNOSIS — Z1231 Encounter for screening mammogram for malignant neoplasm of breast: Secondary | ICD-10-CM

## 2019-02-04 ENCOUNTER — Other Ambulatory Visit: Payer: Self-pay | Admitting: Medical

## 2019-02-16 ENCOUNTER — Other Ambulatory Visit: Payer: Self-pay | Admitting: Medical

## 2019-05-07 ENCOUNTER — Other Ambulatory Visit: Payer: Self-pay | Admitting: Medical

## 2019-05-23 DIAGNOSIS — E119 Type 2 diabetes mellitus without complications: Secondary | ICD-10-CM | POA: Diagnosis not present

## 2019-05-23 DIAGNOSIS — H2513 Age-related nuclear cataract, bilateral: Secondary | ICD-10-CM | POA: Diagnosis not present

## 2019-05-23 DIAGNOSIS — H35033 Hypertensive retinopathy, bilateral: Secondary | ICD-10-CM | POA: Diagnosis not present

## 2019-05-23 DIAGNOSIS — I10 Essential (primary) hypertension: Secondary | ICD-10-CM | POA: Diagnosis not present

## 2019-05-23 LAB — HM DIABETES EYE EXAM

## 2019-06-09 ENCOUNTER — Encounter: Payer: Self-pay | Admitting: Medical

## 2019-06-26 ENCOUNTER — Other Ambulatory Visit: Payer: Self-pay

## 2019-06-26 ENCOUNTER — Encounter: Payer: Self-pay | Admitting: Medical

## 2019-06-26 ENCOUNTER — Ambulatory Visit (INDEPENDENT_AMBULATORY_CARE_PROVIDER_SITE_OTHER): Payer: Medicare Other | Admitting: Medical

## 2019-06-26 VITALS — BP 176/90 | HR 62 | Temp 98.6°F | Ht 67.0 in | Wt 187.8 lb

## 2019-06-26 DIAGNOSIS — Z7185 Encounter for immunization safety counseling: Secondary | ICD-10-CM

## 2019-06-26 DIAGNOSIS — I10 Essential (primary) hypertension: Secondary | ICD-10-CM | POA: Diagnosis not present

## 2019-06-26 DIAGNOSIS — Z7189 Other specified counseling: Secondary | ICD-10-CM

## 2019-06-26 DIAGNOSIS — E118 Type 2 diabetes mellitus with unspecified complications: Secondary | ICD-10-CM | POA: Diagnosis not present

## 2019-06-26 DIAGNOSIS — E785 Hyperlipidemia, unspecified: Secondary | ICD-10-CM | POA: Diagnosis not present

## 2019-06-26 NOTE — Progress Notes (Signed)
Subjective: Chief Complaint  Patient presents with  . Diabetes   Here for med check  Diabetes - checking sugars some.   Numbers have been elevated recently.  She has not been exercising much as she was a year ago.  No foot concerns, no polydipsia, no polyuria, doing well.    Blood pressure has been a little elevated of late but not like today.  She is using over-the-counter sinus medication which may be raising her blood pressure.  She has some sinus pressure in last week or so but no other major symptoms.  Feeling better No chest pain, no dyspnea.  Compliant with medication for BP and cholesterol without c/o.  Although she is not taking plain HCTZ but a HCT in combination she still gets refills from the pharmacy.  No prior cardiology evaluation other than EKG  She had a Covid shot back in January  No new c/o.     Past Medical History:  Diagnosis Date  . Depression   . Diabetes mellitus   . Former smoker   . Hyperlipidemia   . Hypertension   . Osteopenia    2016 bone density, normal appearing bone density 2019  . Routine gynecological examination    Dr. Gaynell Face  . Vitamin D deficiency   . Wears dentures   . Wears glasses    Current Outpatient Medications on File Prior to Visit  Medication Sig Dispense Refill  . aspirin (ASPIRIN LOW DOSE) 81 MG EC tablet Take 1 tablet (81 mg total) by mouth daily. Swallow whole. 90 tablet 3  . atenolol (TENORMIN) 50 MG tablet Take 1 tablet (50 mg total) by mouth daily. 90 tablet 3  . buPROPion (WELLBUTRIN XL) 150 MG 24 hr tablet Take 1 tablet (150 mg total) by mouth daily. 90 tablet 3  . cholecalciferol (VITAMIN D) 1000 units tablet Take 1 tablet (1,000 Units total) by mouth daily. 90 tablet 3  . co-enzyme Q-10 50 MG capsule Take 1 capsule (50 mg total) by mouth daily. 90 capsule 3  . diltiazem (CARDIZEM CD) 300 MG 24 hr capsule TAKE 1 CAPSULE(300 MG) BY MOUTH DAILY 90 capsule 0  . fish oil-omega-3 fatty acids 1000 MG capsule Take 1 g by  mouth daily.      Marland Kitchen KOMBIGLYZE XR 5-500 MG TB24 TAKE 1 TABLET BY MOUTH ONCE DAILY 90 tablet 3  . losartan-hydrochlorothiazide (HYZAAR) 100-25 MG tablet Take 1 tablet by mouth daily. 90 tablet 3  . rosuvastatin (CRESTOR) 20 MG tablet TAKE 1 TABLET(20 MG) BY MOUTH AT BEDTIME 90 tablet 3  . sertraline (ZOLOFT) 100 MG tablet TAKE HALF TABLET BY MOUTH EVERY DAY 90 tablet 1  . vitamin E 200 UNIT capsule Take 200 Units by mouth daily.    . hydrochlorothiazide (HYDRODIURIL) 25 MG tablet TAKE 1 TABLET BY MOUTH EVERY DAY (Patient not taking: Reported on 06/26/2019) 90 tablet 0   No current facility-administered medications on file prior to visit.     Objective: BP (!) 176/90   Pulse 62   Temp 98.6 F (37 C)   Ht 5\' 7"  (1.702 m)   Wt 187 lb 12.8 oz (85.2 kg)   LMP  (LMP Unknown)   SpO2 97%   BMI 29.41 kg/m   Wt Readings from Last 3 Encounters:  06/26/19 187 lb 12.8 oz (85.2 kg)  10/07/18 177 lb 9.6 oz (80.6 kg)  04/15/18 181 lb 3.2 oz (82.2 kg)   General appearance: alert, no distress, WD/WN,  Neck: supple, no lymphadenopathy,  no thyromegaly, no masses, no bruits Heart: RRR, normal S1, S2, no murmurs Lungs: CTA bilaterally, no wheezes, rhonchi, or rales Abdomen positive bowel sounds, soft, nontender, no mass no organomegaly Pulses: 2+ symmetric, upper extremities, normal cap refill Ext: no edema   Diabetic Foot Exam - Simple   Simple Foot Form Diabetic Foot exam was performed with the following findings: Yes 06/26/2019  1:36 PM  Visual Inspection No deformities, no ulcerations, no other skin breakdown bilaterally: Yes Sensation Testing Intact to touch and monofilament testing bilaterally: Yes Pulse Check Posterior Tibialis and Dorsalis pulse intact bilaterally: Yes Comments       Assessment: Encounter Diagnoses  Name Primary?  . Essential hypertension Yes  . Diabetes mellitus with complication ([place])   . Dyslipidemia   . Vaccine counseling        Plan: Diabetes-continue Kombiglyze, c/t glucose monitoring, counseled on healthy diet and exercise, daily foot checks  Hypertension-continue current medication but stop the over-the-counter decongestants.  She will check blood pressures and call back in 2 weeks.  I recommend a baseline CT calcium score cardiology eval but she declines for now.  She wants to get back with [place] on blood pressures in 2 weeks first.  Depression in remission - Doing well on current therapy  Hyperlipidemia-continue rosuvastatin 20 mg daily and aspirin 81 mg daily along with coenzyme Q 10  We will copy her Covid card and update chart record  Shingles vaccine:  I recommend you have a shingles vaccine to help prevent shingles or herpes zoster outbreak.   Please call your insurer to inquire about coverage for the Shingrix vaccine given in 2 doses.   Some insurers cover this vaccine after age 42, some cover this after age 41.  If your insurer covers this, then call to schedule appointment to have this vaccine here.   Avneet was seen today for diabetes.  Diagnoses and all orders for this visit:  Essential hypertension -     Comprehensive metabolic panel -     CBC -     Lipid panel -     Microalbumin/Creatinine Ratio, Urine  Diabetes mellitus with complication (HCC) -     Comprehensive metabolic panel -     Hemoglobin A1c -     Microalbumin/Creatinine Ratio, Urine  Dyslipidemia -     Lipid panel  Vaccine counseling

## 2019-06-27 LAB — COMPREHENSIVE METABOLIC PANEL
ALT: 11 IU/L (ref 0–32)
AST: 19 IU/L (ref 0–40)
Albumin/Globulin Ratio: 1.9 (ref 1.2–2.2)
Albumin: 4.3 g/dL (ref 3.8–4.8)
Alkaline Phosphatase: 76 IU/L (ref 39–117)
BUN/Creatinine Ratio: 15 (ref 12–28)
BUN: 15 mg/dL (ref 8–27)
Bilirubin Total: <0.2 mg/dL (ref 0.0–1.2)
CO2: 24 mmol/L (ref 20–29)
Calcium: 9.6 mg/dL (ref 8.7–10.3)
Chloride: 102 mmol/L (ref 96–106)
Creatinine, Ser: 0.99 mg/dL (ref 0.57–1.00)
GFR calc Af Amer: 67 mL/min/{1.73_m2} (ref >59)
GFR calc non Af Amer: 58 mL/min/{1.73_m2} — ABNORMAL LOW (ref >59)
Globulin, Total: 2.3 g/dL (ref 1.5–4.5)
Glucose: 91 mg/dL (ref 65–99)
Potassium: 4.3 mmol/L (ref 3.5–5.2)
Sodium: 140 mmol/L (ref 134–144)
Total Protein: 6.6 g/dL (ref 6.0–8.5)

## 2019-06-27 LAB — CBC
Hematocrit: 38.1 % (ref 34.0–46.6)
Hemoglobin: 12.7 g/dL (ref 11.1–15.9)
MCH: 29.7 pg (ref 26.6–33.0)
MCHC: 33.3 g/dL (ref 31.5–35.7)
MCV: 89 fL (ref 79–97)
Platelets: 245 10*3/uL (ref 150–450)
RBC: 4.27 x10E6/uL (ref 3.77–5.28)
RDW: 14 % (ref 11.7–15.4)
WBC: 6.9 10*3/uL (ref 3.4–10.8)

## 2019-06-27 LAB — MICROALBUMIN / CREATININE URINE RATIO
Creatinine, Urine: 67.6 mg/dL
Microalb/Creat Ratio: <4 mg/g creat (ref 0–29)
Microalbumin, Urine: <3 ug/mL

## 2019-06-27 LAB — HEMOGLOBIN A1C
Est. average glucose Bld gHb Est-mCnc: 154 mg/dL
Hgb A1c MFr Bld: 7 % — ABNORMAL HIGH (ref 4.8–5.6)

## 2019-06-27 LAB — LIPID PANEL
Chol/HDL Ratio: 2.7 ratio (ref 0.0–4.4)
Cholesterol, Total: 144 mg/dL (ref 100–199)
HDL: 54 mg/dL (ref >39)
LDL Chol Calc (NIH): 71 mg/dL (ref 0–99)
Triglycerides: 105 mg/dL (ref 0–149)
VLDL Cholesterol Cal: 19 mg/dL (ref 5–40)

## 2019-06-30 ENCOUNTER — Other Ambulatory Visit: Payer: Self-pay | Admitting: Medical

## 2019-07-23 ENCOUNTER — Telehealth: Payer: Self-pay | Admitting: Medical

## 2019-07-23 NOTE — Telephone Encounter (Signed)
Let her know I appreciate the recent blood pressure readings she sent which are all good.  Continue current medications

## 2019-07-23 NOTE — Telephone Encounter (Signed)
Pt. Aware and will continue current medications.

## 2019-08-07 ENCOUNTER — Other Ambulatory Visit: Payer: Self-pay | Admitting: Medical

## 2019-08-07 NOTE — Telephone Encounter (Signed)
[facility] is requesting to fill pt diltiazem and sertraline. Please advise. KH

## 2019-10-29 ENCOUNTER — Other Ambulatory Visit: Payer: Self-pay | Admitting: Medical

## 2019-10-29 DIAGNOSIS — I10 Essential (primary) hypertension: Secondary | ICD-10-CM

## 2019-10-29 NOTE — Telephone Encounter (Signed)
Lmom for patient to call and schedule Medicare Wellness visit.

## 2019-12-03 ENCOUNTER — Ambulatory Visit (INDEPENDENT_AMBULATORY_CARE_PROVIDER_SITE_OTHER): Payer: Medicare Other | Admitting: Medical

## 2019-12-03 ENCOUNTER — Encounter: Payer: Self-pay | Admitting: Medical

## 2019-12-03 ENCOUNTER — Other Ambulatory Visit: Payer: Self-pay

## 2019-12-03 VITALS — BP 170/96 | HR 63 | Ht 67.0 in | Wt 186.2 lb

## 2019-12-03 DIAGNOSIS — E785 Hyperlipidemia, unspecified: Secondary | ICD-10-CM

## 2019-12-03 DIAGNOSIS — E118 Type 2 diabetes mellitus with unspecified complications: Secondary | ICD-10-CM

## 2019-12-03 DIAGNOSIS — Z23 Encounter for immunization: Secondary | ICD-10-CM | POA: Diagnosis not present

## 2019-12-03 DIAGNOSIS — I10 Essential (primary) hypertension: Secondary | ICD-10-CM | POA: Diagnosis not present

## 2019-12-03 DIAGNOSIS — Z1159 Encounter for screening for other viral diseases: Secondary | ICD-10-CM | POA: Diagnosis not present

## 2019-12-03 NOTE — Progress Notes (Signed)
Subjective:  Brenda [name] is a 69 y.o. female who presents for Chief Complaint  Patient presents with  . Hypertension      Hypertension, follow-up  BP Readings from Last 3 Encounters:  12/03/19 (!) 170/96  06/26/19 (!) 176/90  10/07/18 130/74   Wt Readings from Last 3 Encounters:  12/03/19 186 lb 3.2 oz (84.5 kg)  06/26/19 187 lb 12.8 oz (85.2 kg)  10/07/18 177 lb 9.6 oz (80.6 kg)     She was last seen for hypertension 5 months ago.  BP at that visit was 176/90. Management since that visit includes Losartan-hydrochlorothiazide, Atenolol and Cardizem.  She reports good compliance with treatment. She is not having side effects.She is following a Regular diet. She walks 3 times a week.  Use of agents associated with hypertension: none.   Outside blood pressures are under 160/94. Symptoms: No chest pain No chest pressure  No palpitations No syncope  No dyspnea No orthopnea  No paroxysmal nocturnal dyspnea No lower extremity edema   Pertinent labs: Lab Results  Component Value Date   CHOL 144 06/26/2019   HDL 54 06/26/2019   LDLCALC 71 06/26/2019   TRIG 105 06/26/2019   CHOLHDL 2.7 06/26/2019   Lab Results  Component Value Date   NA 140 06/26/2019   K 4.3 06/26/2019   CREATININE 0.99 06/26/2019   GFRNONAA 58 (L) 06/26/2019   GFRAA 67 06/26/2019   GLUCOSE 91 06/26/2019     The 10-year ASCVD risk score Denman George DC Jr., et al., 2013) is: 31.6%   ---------------------------------------------------------------------------------------------------  Diabetes-compliant with Kombiglyze, blood sugars tend to run in the 140s.  No foot lesions, no hypoglycemia  Hyperlipidemia-compliant with Crestor without complaint  Walking for exercise  She has 3 daughters, 3 grandchildren, and 2 daughters in [place], lives with 1.  One of her daughters passed away last year with blood cancer  No other aggravating or relieving factors.    No other c/o.  Past Medical  History:  Diagnosis Date  . Depression   . Diabetes mellitus   . Former smoker   . Hyperlipidemia   . Hypertension   . Osteopenia    2016 bone density, normal appearing bone density 2019  . Routine gynecological examination    Dr. Gaynell Face  . Vitamin D deficiency   . Wears dentures   . Wears glasses     The following portions of the patient's history were reviewed and updated as appropriate: allergies, current medications, past family history, past medical history, past social history, past surgical history and problem list.  ROS Otherwise as in subjective above  Objective: BP (!) 170/96   Pulse 63   Ht 5\' 7"  (1.702 m)   Wt 186 lb 3.2 oz (84.5 kg)   LMP  (LMP Unknown)   SpO2 96%   BMI 29.16 kg/m   Wt Readings from Last 3 Encounters:  12/03/19 186 lb 3.2 oz (84.5 kg)  06/26/19 187 lb 12.8 oz (85.2 kg)  10/07/18 177 lb 9.6 oz (80.6 kg)    General appearance: alert, no distress, well developed, well nourished Neck: supple, no lymphadenopathy, no thyromegaly, no masses, no bruits Heart: RRR, normal S1, S2, no murmurs Lungs: CTA bilaterally, no wheezes, rhonchi, or rales Abdomen: +bs, soft, non tender, non distended, no masses, no hepatomegaly, no splenomegaly Pulses: 2+ radial pulses, 2+ pedal pulses, normal cap refill Ext: no edema  Diabetic Foot Exam - Simple   Simple Foot Form Diabetic Foot exam was performed with the  following findings: Yes 12/03/2019  9:45 PM  Visual Inspection No deformities, no ulcerations, no other skin breakdown bilaterally: Yes Sensation Testing Intact to touch and monofilament testing bilaterally: Yes Pulse Check Posterior Tibialis and Dorsalis pulse intact bilaterally: Yes Comments      Assessment: Encounter Diagnoses  Name Primary?  . Essential hypertension Yes  . Diabetes mellitus with complication (HCC)   . Dyslipidemia   . Encounter for hepatitis C screening test for low risk patient   . Need for influenza vaccination   .  Need for vaccination      Plan: Hypertension-she notes home readings are normal despite the high readings here.  EKG reviewed.,  Labs as below.  Consider dose modification today pending labs.  Advise she cut back on watching all the new cycles she watches daily which can be stressful.  Continue to exercise.  Limit salt.  Diabetes-labs today, continue current medication, continue daily foot checks, discussed nutrition  Dyslipidemia-continue statin  Hep C screen today  Counseled on the influenza virus vaccine.  Vaccine information sheet given.   High dose Influenza vaccine given after consent obtained.  Counseled on the Moderna Covid virus vaccine.  Vaccine information sheet given.  Moderna booster vaccine given after consent obtained.   Brenda [name] was seen today for hypertension.  Diagnoses and all orders for this visit:  Essential hypertension -     EKG 12-Lead  Diabetes mellitus with complication (HCC) -     Hemoglobin A1c -     EKG 12-Lead  Dyslipidemia  Encounter for hepatitis C screening test for low risk patient -     Hepatitis C antibody  Need for influenza vaccination -     Flu Vaccine QUAD High Dose(Fluad)  Need for vaccination -     Moderna SARS-CoV-2 Vaccine    Follow up: pending labs

## 2019-12-04 LAB — HEMOGLOBIN A1C
Est. average glucose Bld gHb Est-mCnc: 169 mg/dL
Hgb A1c MFr Bld: 7.5 % — ABNORMAL HIGH (ref 4.8–5.6)

## 2019-12-04 LAB — HEPATITIS C ANTIBODY: Hep C Virus Ab: <0.1 s/co ratio (ref 0.0–0.9)

## 2019-12-08 ENCOUNTER — Other Ambulatory Visit: Payer: Self-pay | Admitting: Medical

## 2019-12-08 DIAGNOSIS — Z1231 Encounter for screening mammogram for malignant neoplasm of breast: Secondary | ICD-10-CM

## 2019-12-09 ENCOUNTER — Other Ambulatory Visit: Payer: Self-pay

## 2019-12-09 DIAGNOSIS — I10 Essential (primary) hypertension: Secondary | ICD-10-CM

## 2019-12-19 ENCOUNTER — Other Ambulatory Visit: Payer: Self-pay | Admitting: Medical

## 2019-12-19 DIAGNOSIS — I10 Essential (primary) hypertension: Secondary | ICD-10-CM

## 2020-01-13 ENCOUNTER — Other Ambulatory Visit: Payer: Self-pay

## 2020-01-13 ENCOUNTER — Ambulatory Visit
Admission: RE | Admit: 2020-01-13 | Discharge: 2020-01-13 | Disposition: A | Discharge status: Home / Self Care | Payer: Medicare Other | Source: Ambulatory Visit | Attending: Medical | Admitting: Medical

## 2020-01-13 DIAGNOSIS — Z1231 Encounter for screening mammogram for malignant neoplasm of breast: Secondary | ICD-10-CM

## 2020-02-24 ENCOUNTER — Other Ambulatory Visit: Payer: Self-pay | Admitting: Medical

## 2020-03-31 ENCOUNTER — Other Ambulatory Visit: Payer: Self-pay | Admitting: Medical

## 2020-04-13 ENCOUNTER — Other Ambulatory Visit: Payer: Self-pay | Admitting: Medical

## 2020-04-13 ENCOUNTER — Ambulatory Visit (INDEPENDENT_AMBULATORY_CARE_PROVIDER_SITE_OTHER): Payer: Medicare Other | Admitting: Medical

## 2020-04-13 ENCOUNTER — Other Ambulatory Visit: Payer: Self-pay

## 2020-04-13 ENCOUNTER — Encounter: Payer: Self-pay | Admitting: Medical

## 2020-04-13 VITALS — BP 166/84 | HR 69 | Ht 67.0 in | Wt 186.6 lb

## 2020-04-13 DIAGNOSIS — Z0000 Encounter for general adult medical examination without abnormal findings: Secondary | ICD-10-CM | POA: Diagnosis not present

## 2020-04-13 DIAGNOSIS — Z7185 Encounter for immunization safety counseling: Secondary | ICD-10-CM | POA: Diagnosis not present

## 2020-04-13 DIAGNOSIS — I10 Essential (primary) hypertension: Secondary | ICD-10-CM

## 2020-04-13 DIAGNOSIS — E559 Vitamin D deficiency, unspecified: Secondary | ICD-10-CM | POA: Diagnosis not present

## 2020-04-13 DIAGNOSIS — Z87891 Personal history of nicotine dependence: Secondary | ICD-10-CM | POA: Diagnosis not present

## 2020-04-13 DIAGNOSIS — E785 Hyperlipidemia, unspecified: Secondary | ICD-10-CM

## 2020-04-13 DIAGNOSIS — E1169 Type 2 diabetes mellitus with other specified complication: Secondary | ICD-10-CM | POA: Insufficient documentation

## 2020-04-13 DIAGNOSIS — Z1211 Encounter for screening for malignant neoplasm of colon: Secondary | ICD-10-CM | POA: Insufficient documentation

## 2020-04-13 DIAGNOSIS — Z23 Encounter for immunization: Secondary | ICD-10-CM

## 2020-04-13 DIAGNOSIS — E118 Type 2 diabetes mellitus with unspecified complications: Secondary | ICD-10-CM | POA: Diagnosis not present

## 2020-04-13 DIAGNOSIS — F411 Generalized anxiety disorder: Secondary | ICD-10-CM | POA: Diagnosis not present

## 2020-04-13 DIAGNOSIS — E2839 Other primary ovarian failure: Secondary | ICD-10-CM

## 2020-04-13 DIAGNOSIS — Z780 Asymptomatic menopausal state: Secondary | ICD-10-CM | POA: Diagnosis not present

## 2020-04-13 DIAGNOSIS — F325 Major depressive disorder, single episode, in full remission: Secondary | ICD-10-CM | POA: Diagnosis not present

## 2020-04-13 LAB — POCT GLYCOSYLATED HEMOGLOBIN (HGB A1C): Hemoglobin A1C: 6.8 % — AB (ref 4.0–5.6)

## 2020-04-13 MED ORDER — VALSARTAN-HYDROCHLOROTHIAZIDE 320-25 MG PO TABS: 1.0000 | ORAL_TABLET | Freq: Every day | ORAL | 0 refills | Status: DC

## 2020-04-13 MED ORDER — ATENOLOL 50 MG PO TABS: ORAL_TABLET | ORAL | 3 refills | Status: DC

## 2020-04-13 NOTE — Patient Instructions (Signed)
A personalized plan was printed today for your records and use.   Personalized health advice and education was given today to reduce health risks and promote self management and wellness.  Information regarding end of life planning was discussed today.  Today you had a preventative care visit or wellness visit.    Topics today may have included healthy lifestyle, diet, exercise, preventative care, vaccinations, sick and well care, proper use of emergency dept and after hours care, as well as other concerns.     Recommendations: Continue to return yearly for your annual wellness and preventative care visits.  This gives [place] a chance to discuss healthy lifestyle, exercise, vaccinations, review your chart record, and perform screenings where appropriate.  I recommend you see your eye doctor yearly for routine vision care.  I recommend you see your dentist yearly for routine dental care including hygiene visits twice yearly.  See your gynecologist yearly for routine gynecological care.    Vaccination recommendations were reviewed Counseled on the pneumococcal vaccine.  Vaccine information sheet given.  Pneumococcal vaccine PPSV23 given after consent obtained.  You are up-to-date on COVID vaccine  You are up-to-date on Prevnar 13 pneumonia vaccine  You are up-to-date on tetanus vaccine  You have had the prior Zostavax shingles vaccine but I recommend you have the Shingrix newer vaccine.  Shingles vaccine:  I recommend you have a shingles vaccine to help prevent shingles or herpes zoster outbreak.   Please call your insurer to inquire about coverage for the Shingrix vaccine given in 2 doses.   Some insurers cover this vaccine after age 57, some cover this after age 37.  If your insurer covers this, then call to schedule appointment to have this vaccine here.    Screening for cancer: Breast cancer screening: You should perform a self breast exam monthly.   We reviewed recommendations for  regular mammograms and breast cancer screening.  Colon cancer screening:  You have declined repeat colonoscopy for years  I still recommend some type of screening.  I recommend a Cologuard test.  You want to think about this more  Skin cancer screening: Check your skin regularly for new changes, growing lesions, or other lesions of concern Come in for evaluation if you have skin lesions of concern.  Lung cancer screening: If you have a greater than 30 pack year history of tobacco use, then you qualify for lung cancer screening with a chest CT scan  We currently don't have screenings for other cancers besides breast, cervical, colon, and lung cancers.  If you have a strong family history of cancer or have other cancer screening concerns, please let me know.    Bone health: Get at least 150 minutes of aerobic exercise weekly Get weight bearing exercise at least once weekly  The last bone density test I have on 06/13/2017 was normal.  This can be rechecked periodically.  We often recommend this test every 2 years.  Let me know if you want to repeat this in 2022 or wait till next year?   Heart health: Get at least 150 minutes of aerobic exercise weekly Limit alcohol It is important to maintain a healthy blood pressure and healthy cholesterol numbers  We discussed heart disease evaluation by heart doctor.  I had recommended this in 2021.  He declined to see a cardiologist at that time.  The reasons for the recommendation was given your underlying diagnoses, age over 30, diabetes risk and the fact that your blood pressures have run  a little bit uncontrolled at times.     It would be reasonable to have a calcium CT score which looks at the coronary arteries or a baseline echocardiogram.  If you want to pursue this let me know and I will make the referral.   Separate significant issues discussed: Hypertension-still not quite to goal.  We discussed goal being 130/80 or less.  She is running a  little over the still.  We had a long discussion about different medicines, benefits versus risk of various medications and why we use certain medicines and not others.  She will continue atenolol, continue diltiazem that she has been on a long time.  We will change from losartan HCT to valsartan HCT for better efficacy.  We discussed Edarbichlor as another option but she declines that today given the likelihood of higher cost and more likely to lower potassium  Diabetes type 2-hemoglobin A1c reviewed today.  Continue healthy diet, regular exercise, daily foot checks, yearly eye doctor visits, routine follow-up  Hyperlipidemia associated with diabetes-continue rosuvastatin, reviewed labs from 2021  Vitamin D deficiency-continue vitamin D 2000 units daily  Postmenopausal estrogen deficiency-see bone health above  Depression in remission

## 2020-04-13 NOTE — Progress Notes (Signed)
Subjective:    Brenda [name] is a 70 y.o. female who presents for Preventative Services visit and chronic medical problems/med check visit.    Primary Care Provider Trenae Brunke, Kermit Balo, PA-C here for primary care  Current Health Care Team:  Dentist  Eye doctor, Dr. Blima Dessert  Dr. Coral Ceo, gynecology  Medical Services you may have received from other than [facility] providers in the past year (date may be approximate) Eye doctor  Exercise Current exercise habits: walking  Nutrition/Diet Current diet: eating healthy  Depression Screen Depression screen [facility] 2/9 04/13/2020  Decreased Interest 0  Down, Depressed, Hopeless 0  PHQ - 2 Score 0    Activities of Daily Living Screen/Functional Status Survey Is the patient deaf or have difficulty hearing?: No Does the patient have difficulty seeing, even when wearing glasses/contacts?: No Does the patient have difficulty concentrating, remembering, or making decisions?: No Does the patient have difficulty walking or climbing stairs?: No Does the patient have difficulty dressing or bathing?: No Does the patient have difficulty doing errands alone such as visiting a doctor's office or shopping?: No  Can patient draw a clock face showing 3:15 oclock, yes  Fall Risk Screen Fall Risk  04/13/2020 09/16/2018 08/06/2017 10/30/2016 06/26/2016  Falls in the past year? 0 (No Data) No No No  Comment - Emmi Telephone Survey: data to providers prior to load - - -  Number falls in past yr: - (No Data) - - -  Comment - Emmi Telephone Survey Actual Response =  - - -  Risk for fall due to : No Fall Risks - - - -  Follow up Falls evaluation completed - - - -    Gait Assessment: Normal gait observed - yes  Advanced directives Does patient have a Health Care Power of Attorney? Yes Does patient have a Living Will? Yes  Past Medical History:  Diagnosis Date  . Depression   . Diabetes mellitus   . Former smoker   . Hyperlipidemia   .  Hypertension   . Osteopenia    2016 bone density, normal appearing bone density 2019  . Routine gynecological examination    Dr. Gaynell Face  . Vitamin D deficiency   . Wears dentures   . Wears glasses     Past Surgical History:  Procedure Laterality Date  . CESAREAN SECTION  04/12/1983  . COLONOSCOPY  2006   declines further colonoscopy    Social History   Socioeconomic History  . Marital status: Widowed    Spouse name: Not on file  . Number of children: Not on file  . Years of education: Not on file  . Highest education level: Not on file  Occupational History  . Not on file  Tobacco Use  . Smoking status: Former Games developer  . Smokeless tobacco: Never Used  Substance and Sexual Activity  . Alcohol use: No  . Drug use: No  . Sexual activity: Not Currently    Birth control/protection: Post-menopausal  Other Topics Concern  . 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  Lives with her daughter.   Former smoker,  Uses non nicotine vape now.     Social Determinants of Health   Financial Resource Strain: Not on file  Food Insecurity: Not on file  Transportation Needs: Not on file  Physical Activity: Not on file  Stress: Not on file  Social Connections:  Not on file  Intimate Partner Violence: Not on file    Family History  Problem Relation Age of Onset  . Heart disease Father        valvuloplasty  . Hypertension Mother   . Heart disease Mother        died at 43 with angioplasty complication  . Hypertension Sister   . Other Sister        complications of HIV  . Hypertension Sister   . Hypertension Sister      Current Outpatient Medications:  .  valsartan-hydrochlorothiazide (DIOVAN-HCT) 320-25 MG tablet, Take 1 tablet by mouth daily., Disp: 30 tablet, Rfl: 0 .  atenolol (TENORMIN) 50 MG tablet, TAKE 1 TABLET(50 MG) BY MOUTH DAILY, Disp: 90 tablet, Rfl: 3 .  buPROPion  (WELLBUTRIN XL) 150 MG 24 hr tablet, TAKE 1 TABLET(150 MG) BY MOUTH DAILY, Disp: 90 tablet, Rfl: 0 .  co-enzyme Q-10 50 MG capsule, Take 1 capsule (50 mg total) by mouth daily., Disp: 90 capsule, Rfl: 3 .  diltiazem (CARDIZEM CD) 300 MG 24 hr capsule, TAKE 1 CAPSULE(300 MG) BY MOUTH DAILY, Disp: 90 capsule, Rfl: 0 .  fish oil-omega-3 fatty acids 1000 MG capsule, Take 1 g by mouth daily.  , Disp: , Rfl:  .  KOMBIGLYZE XR 5-500 MG TB24, TAKE 1 TABLET BY MOUTH ONCE DAILY, Disp: 90 tablet, Rfl: 0 .  rosuvastatin (CRESTOR) 20 MG tablet, TAKE 1 TABLET(20 MG) BY MOUTH AT BEDTIME, Disp: 90 tablet, Rfl: 3 .  sertraline (ZOLOFT) 100 MG tablet, TAKE HALF TABLET BY MOUTH EVERY DAY, Disp: 90 tablet, Rfl: 1 .  vitamin E 200 UNIT capsule, Take 200 Units by mouth daily., Disp: , Rfl:   Allergies  Allergen Reactions  . Janumet Xr [Sitagliptin-Metformin Hcl Er]     Bad headaches  . Metformin And Related     Nausea, anorexia  . Nuprin [Ibuprofen] Hives    Can tolerate generic Ibuprofen    History reviewed: allergies, current medications, past family history, past medical history, past social history, past surgical history and problem list  Chronic issues discussed: HTN - compliant with medicaiton but has questions about her medications vs other BP medicaiton. Home blood pressure readings typically average.  In the high 130s over 80s such as 138/80.  The lowest she has seen is 112/66 with felt dizzy.  The highest she has seen is 166 systolic.  Vitamin D deficiency-compliant with vitamin D  2000 daily  She noticed from her last visit here she did not go see the cardiologist as we discussed. she decided to hold off on this since she did not have symptoms and did not feel the need to go see them   Acute issues discussed: none  Objective:      Biometrics BP (!) 166/84   Pulse 69   Ht 5\' 7"  (1.702 m)   Wt 186 lb 9.6 oz (84.6 kg)   LMP  (LMP Unknown)   SpO2 95%   BMI 29.23 kg/m    BP Readings  from Last 3 Encounters:  04/13/20 (!) 166/84  12/03/19 (!) 170/96  06/26/19 (!) 176/90   Wt Readings from Last 3 Encounters:  04/13/20 186 lb 9.6 oz (84.6 kg)  12/03/19 186 lb 3.2 oz (84.5 kg)  06/26/19 187 lb 12.8 oz (85.2 kg)     Cognitive Testing  Alert? Yes  Normal Appearance?Yes  Oriented to person? Yes  Place? Yes   Time? Yes  Recall of three objects?  Yes  Can perform simple calculations? Yes  Displays appropriate judgment?Yes  Can read the correct time from a watch face?Yes  General appearance: alert, no distress, WD/WN, African American female  Nutritional Status: Inadequate calore intake? no Loss of muscle mass? no Loss of fat beneath skin? no Localized or general edema? no Diminished functional status? no  Other pertinent exam: Heart: RRR, normal S1, S2, no murmurs Lungs: CTA bilaterally, no wheezes, rhonchi, or rales Extremities: no edema, no cyanosis, no clubbing Pulses: 2+ symmetric, upper and lower extremities, normal cap refill Psychiatric: normal affect, behavior normal, pleasant   Diabetic Foot Exam - Simple   Simple Foot Form Diabetic Foot exam was performed with the following findings: Yes 04/13/2020  2:49 PM  Visual Inspection No deformities, no ulcerations, no other skin breakdown bilaterally: Yes Sensation Testing Intact to touch and monofilament testing bilaterally: Yes Pulse Check See comments: Yes Comments 1-2 + pedal pulses       Assessment:   Encounter Diagnoses  Name Primary?  . Essential hypertension Yes  . Medicare annual wellness visit, subsequent   . Need for pneumococcal vaccination   . Diabetes mellitus with complication (HCC)   . Vitamin D deficiency   . Vaccine counseling   . Generalized anxiety disorder   . Former smoker   . Estrogen deficiency   . Depression, major, in remission (HCC)   . Screen for colon cancer   . Post-menopausal   . Hyperlipidemia associated with type 2 diabetes mellitus (HCC)      Plan:    A preventative services visit was completed today.  During the course of the visit today, we discussed and counseled about appropriate screening and preventive services.  A health risk assessment was established today that included a review of current medications, allergies, social history, family history, medical and preventative health history, biometrics, and preventative screenings to identify potential safety concerns or impairments.  A personalized plan was printed today for your records and use.   Personalized health advice and education was given today to reduce health risks and promote self management and wellness.  Information regarding end of life planning was discussed today.   Recommendations: Continue to return yearly for your annual wellness and preventative care visits.  This gives [place] a chance to discuss healthy lifestyle, exercise, vaccinations, review your chart record, and perform screenings where appropriate.  I recommend you see your eye doctor yearly for routine vision care.  I recommend you see your dentist yearly for routine dental care including hygiene visits twice yearly.  See your gynecologist yearly for routine gynecological care.    Vaccination recommendations were reviewed Counseled on the pneumococcal vaccine.  Vaccine information sheet given.  Pneumococcal vaccine PPSV23 given after consent obtained.  You are up-to-date on COVID vaccine  You are up-to-date on Prevnar 13 pneumonia vaccine  You are up-to-date on tetanus vaccine  You have had the prior Zostavax shingles vaccine but I recommend you have the Shingrix newer vaccine.  Shingles vaccine:  I recommend you have a shingles vaccine to help prevent shingles or herpes zoster outbreak.   Please call your insurer to inquire about coverage for the Shingrix vaccine given in 2 doses.   Some insurers cover this vaccine after age 67, some cover this after age 75.  If your insurer covers this, then call to  schedule appointment to have this vaccine here.    Screening for cancer: Breast cancer screening: You should perform a self breast exam monthly.   We reviewed recommendations  for regular mammograms and breast cancer screening.  Colon cancer screening:  You have declined repeat colonoscopy for years  I still recommend some type of screening.  I recommend a Cologuard test.  You want to think about this more  Skin cancer screening: Check your skin regularly for new changes, growing lesions, or other lesions of concern Come in for evaluation if you have skin lesions of concern.  Lung cancer screening: If you have a greater than 30 pack year history of tobacco use, then you qualify for lung cancer screening with a chest CT scan  We currently don't have screenings for other cancers besides breast, cervical, colon, and lung cancers.  If you have a strong family history of cancer or have other cancer screening concerns, please let me know.    Bone health: Get at least 150 minutes of aerobic exercise weekly Get weight bearing exercise at least once weekly  The last bone density test I have on 06/13/2017 was normal.  This can be rechecked periodically.  We often recommend this test every 2 years.  Let me know if you want to repeat this in 2022 or wait till next year?   Heart health: Get at least 150 minutes of aerobic exercise weekly Limit alcohol It is important to maintain a healthy blood pressure and healthy cholesterol numbers  We discussed heart disease evaluation by heart doctor.  I had recommended this in 2021.  He declined to see a cardiologist at that time.  The reasons for the recommendation was given your underlying diagnoses, age over 54, diabetes risk and the fact that your blood pressures have run a little bit uncontrolled at times.     It would be reasonable to have a calcium CT score which looks at the coronary arteries or a baseline echocardiogram.  If you want to pursue  this let me know and I will make the referral.   Separate significant issues discussed: Hypertension-still not quite to goal.  We discussed goal being 130/80 or less.  She is running a little over goal still.  We had a long discussion about different medicines, benefits versus risk of various medications and why we use certain medicines and not others.  She will continue atenolol, continue diltiazem that she has been on a long time.  We will change from losartan HCT to valsartan HCT for better efficacy.  We discussed Edarbichlor as another option but she declines that today given the likelihood of higher cost and more likely to lower potassium  Diabetes type 2-hemoglobin A1c reviewed today.  Continue healthy diet, regular exercise, daily foot checks, yearly eye doctor visits, routine follow-up  Hyperlipidemia associated with diabetes-continue rosuvastatin, reviewed labs from 2021  Vitamin D deficiency-continue vitamin D 2000 units daily  Postmenopausal estrogen deficiency-see bone health above  Depression in remission  Get [place] a copy of your advanced directives.    Brenda [name] was seen today for medicare wellness.  Diagnoses and all orders for this visit:  Essential hypertension -     atenolol (TENORMIN) 50 MG tablet; TAKE 1 TABLET(50 MG) BY MOUTH DAILY  Medicare annual wellness visit, subsequent  Need for pneumococcal vaccination  Diabetes mellitus with complication (HCC) -     HgB A1c  Vitamin D deficiency  Vaccine counseling  Generalized anxiety disorder  Former smoker  Estrogen deficiency  Depression, major, in remission (HCC)  Screen for colon cancer  Post-menopausal  Hyperlipidemia associated with type 2 diabetes mellitus (HCC)  Other orders -  valsartan-hydrochlorothiazide (DIOVAN-HCT) 320-25 MG tablet; Take 1 tablet by mouth daily. -     Pneumococcal polysaccharide vaccine 23-valent greater than or equal to 2yo subcutaneous/IM     Follow-up pending  labs, yearly for physical      Medicare Attestation A preventative services visit was completed today.  During the course of the visit the patient was educated and counseled about appropriate screening and preventive services.  A health risk assessment was established with the patient that included a review of current medications, allergies, social history, family history, medical and preventative health history, biometrics, and preventative screenings to identify potential safety concerns or impairments.  A personalized plan was printed today for the patient's records and use.   Personalized health advice and education was given today to reduce health risks and promote self management and wellness.  Information regarding end of life planning was discussed today.  Kristian CoveyShane Odena Mcquaid, PA-C   04/13/2020

## 2020-04-18 ENCOUNTER — Other Ambulatory Visit: Payer: Self-pay | Admitting: Medical

## 2020-04-19 ENCOUNTER — Other Ambulatory Visit: Payer: Self-pay | Admitting: Medical

## 2020-05-17 ENCOUNTER — Other Ambulatory Visit: Payer: Self-pay | Admitting: Medical

## 2020-05-24 ENCOUNTER — Telehealth: Payer: Self-pay | Admitting: Medical

## 2020-05-24 NOTE — Telephone Encounter (Signed)
Please abstract or put in a nursing encounter and document recent blood pressure at home of 129/77 and a pulse of 69  She has a whole list of readings from the last week and a half.  All are normal except for 2 slightly elevated readings.

## 2020-05-24 NOTE — Telephone Encounter (Signed)
Message has been sent to patient via mychart  

## 2020-05-24 NOTE — Telephone Encounter (Signed)
Please call and thank her for getting me those readings.  It looks like the valsartan is working.

## 2020-05-24 NOTE — Telephone Encounter (Signed)
See prior 2 messages

## 2020-06-01 NOTE — Telephone Encounter (Signed)
Patient has an at home blood pressure reading of 129/77 and a pulse of 69 on 05/24/20

## 2020-06-08 ENCOUNTER — Telehealth: Payer: Self-pay | Admitting: Medical

## 2020-06-08 NOTE — Telephone Encounter (Signed)
Call patient as she hasn't reviewed the my chart reply to her questions    Hello Brenda [name]  For the time being, try cutting the Valsartan HCT in half and just using 1/2 tablet daily.   Lets see if you tolerate this.  Give me some feedback in a week  Vincenza Hews

## 2020-06-09 NOTE — Telephone Encounter (Signed)
Patient has seen this message and responded via mychart.

## 2020-07-17 ENCOUNTER — Other Ambulatory Visit: Payer: Self-pay | Admitting: Medical

## 2020-07-22 ENCOUNTER — Other Ambulatory Visit: Payer: Self-pay | Admitting: Medical

## 2020-08-31 ENCOUNTER — Ambulatory Visit (INDEPENDENT_AMBULATORY_CARE_PROVIDER_SITE_OTHER): Payer: Medicare Other | Admitting: Medical

## 2020-08-31 ENCOUNTER — Other Ambulatory Visit: Payer: Self-pay

## 2020-08-31 VITALS — BP 128/82 | HR 68 | Temp 98.0°F | Wt 185.2 lb

## 2020-08-31 DIAGNOSIS — I10 Essential (primary) hypertension: Secondary | ICD-10-CM

## 2020-08-31 DIAGNOSIS — E559 Vitamin D deficiency, unspecified: Secondary | ICD-10-CM

## 2020-08-31 DIAGNOSIS — E1169 Type 2 diabetes mellitus with other specified complication: Secondary | ICD-10-CM

## 2020-08-31 DIAGNOSIS — E118 Type 2 diabetes mellitus with unspecified complications: Secondary | ICD-10-CM | POA: Diagnosis not present

## 2020-08-31 DIAGNOSIS — E785 Hyperlipidemia, unspecified: Secondary | ICD-10-CM | POA: Diagnosis not present

## 2020-08-31 NOTE — Progress Notes (Signed)
Subjective:   Chief Complaint  Patient presents with   Diabetes    Diabetes check and hypertension f/u BP doing ok so far since last apt. No issues with diabetes since last apt.    Current Health Care Team: Dentist Eye doctor, Dr. Blima Dessert Dr. Coral Ceo, gynecology  Diabetes - fasting glucose running 120-140 fasting.  No foot concerns.  No low readings.   Taking Kombiglyze XR 5/500mg  daily    HTN - compliant with Atenolol 50mg  daily, Diltiazem 300mg  daily, 1/2 tablet of Valsartan HCT 320/25mg  or 160/12.5mg  daily.   No chest pain, no palpation, no edema.   Was having stomach upset on higher dose of valsartan HCT but that has improved since using 1/2 tablet daily.  Home readings lately 128/69, 121/74, 146/82, 140/77 for example.  Vitamin D deficiency-compliant with vitamin D  2000 daily  Hyperlipidemia - compliant with Crestor 20mg  daily.   Past Medical History:  Diagnosis Date   Depression    Diabetes mellitus    Former smoker    Hyperlipidemia    Hypertension    Osteopenia    2016 bone density, normal appearing bone density 2019   Routine gynecological examination    Dr.   Vitamin D deficiency    Wears dentures    Wears glasses    Current Outpatient Medications on File Prior to Visit  Medication Sig Dispense Refill   atenolol (TENORMIN) 50 MG tablet TAKE 1 TABLET(50 MG) BY MOUTH DAILY 90 tablet 3   buPROPion (WELLBUTRIN XL) 150 MG 24 hr tablet TAKE 1 TABLET(150 MG) BY MOUTH DAILY 90 tablet 0   co-enzyme Q-10 50 MG capsule Take 1 capsule (50 mg total) by mouth daily. 90 capsule 3   diltiazem (CARDIZEM CD) 300 MG 24 hr capsule TAKE 1 CAPSULE(300 MG) BY MOUTH DAILY 90 capsule 0   fish oil-omega-3 fatty acids 1000 MG capsule Take 1 g by mouth daily.       KOMBIGLYZE XR 5-500 MG TB24 TAKE 1 TABLET BY MOUTH EVERY DAY 90 tablet 0   rosuvastatin (CRESTOR) 20 MG tablet TAKE 1 TABLET(20 MG) BY MOUTH AT BEDTIME 90 tablet 3   sertraline (ZOLOFT) 100 MG tablet TAKE  HALF TABLET BY MOUTH EVERY DAY 90 tablet 0   valsartan-hydrochlorothiazide (DIOVAN-HCT) 320-25 MG tablet TAKE 1 TABLET BY MOUTH DAILY 90 tablet 0   vitamin E 200 UNIT capsule Take 200 Units by mouth daily.     No current facility-administered medications on file prior to visit.    ROS as in subjective    Objective:   BP 128/82   Pulse 68   Temp 98 F (36.7 C)   Wt 185 lb 3.2 oz (84 kg)   LMP  (LMP Unknown)   BMI 29.01 kg/m    BP Readings from Last 3 Encounters:  08/31/20 128/82  04/13/20 (!) 166/84  12/03/19 (!) 170/96   Wt Readings from Last 3 Encounters:  08/31/20 185 lb 3.2 oz (84 kg)  04/13/20 186 lb 9.6 oz (84.6 kg)  12/03/19 186 lb 3.2 oz (84.5 kg)   Gen: wd, wn, nad Heart: RRR, normal S1, S2, no murmurs Lungs: CTA bilaterally, no wheezes, rhonchi, or rales Extremities: no edema, no cyanosis, no clubbing Pulses: 2+ symmetric, upper and lower extremities, normal cap refill Psychiatric: normal affect, behavior normal, pleasant      Assessment:   Encounter Diagnoses  Name Primary?   Diabetes mellitus with complication (HCC) Yes   Essential hypertension  Hyperlipidemia associated with type 2 diabetes mellitus (HCC)    Vitamin D deficiency    White coat syndrome with diagnosis of hypertension     Plan:   Hypertension - c/t current medications, routine labs today  Diabetes type 2- Continue healthy diet, regular exercise, daily foot checks, yearly eye doctor visits, routine follow-up  Hyperlipidemia associated with diabetes -continue statin  Vit d deficiency - c/t supplement  Sania was seen today for diabetes.  Diagnoses and all orders for this visit:  Diabetes mellitus with complication (HCC) -     Comprehensive metabolic panel -     CBC -     Hemoglobin A1c -     Microalbumin/Creatinine Ratio, Urine  Essential hypertension -     Comprehensive metabolic panel -     CBC -     Microalbumin/Creatinine Ratio, Urine  Hyperlipidemia  associated with type 2 diabetes mellitus (HCC) -     Lipid panel  Vitamin D deficiency  White coat syndrome with diagnosis of hypertension   Follow-up pending labs

## 2020-09-01 ENCOUNTER — Other Ambulatory Visit: Payer: Self-pay | Admitting: Medical

## 2020-09-01 DIAGNOSIS — I10 Essential (primary) hypertension: Secondary | ICD-10-CM

## 2020-09-01 LAB — LIPID PANEL
Chol/HDL Ratio: 3.1 ratio (ref 0.0–4.4)
Cholesterol, Total: 145 mg/dL (ref 100–199)
HDL: 47 mg/dL (ref >39)
LDL Chol Calc (NIH): 80 mg/dL (ref 0–99)
Triglycerides: 99 mg/dL (ref 0–149)
VLDL Cholesterol Cal: 18 mg/dL (ref 5–40)

## 2020-09-01 LAB — COMPREHENSIVE METABOLIC PANEL
ALT: 22 IU/L (ref 0–32)
AST: 22 IU/L (ref 0–40)
Albumin/Globulin Ratio: 2 (ref 1.2–2.2)
Albumin: 4.5 g/dL (ref 3.8–4.8)
Alkaline Phosphatase: 82 IU/L (ref 44–121)
BUN/Creatinine Ratio: 12 (ref 12–28)
BUN: 13 mg/dL (ref 8–27)
Bilirubin Total: <0.2 mg/dL (ref 0.0–1.2)
CO2: 21 mmol/L (ref 20–29)
Calcium: 9.5 mg/dL (ref 8.7–10.3)
Chloride: 104 mmol/L (ref 96–106)
Creatinine, Ser: 1.05 mg/dL — ABNORMAL HIGH (ref 0.57–1.00)
Globulin, Total: 2.3 g/dL (ref 1.5–4.5)
Glucose: 90 mg/dL (ref 65–99)
Potassium: 4.5 mmol/L (ref 3.5–5.2)
Sodium: 139 mmol/L (ref 134–144)
Total Protein: 6.8 g/dL (ref 6.0–8.5)
eGFR: 57 mL/min/{1.73_m2} — ABNORMAL LOW (ref >59)

## 2020-09-01 LAB — CBC
Hematocrit: 36.6 % (ref 34.0–46.6)
Hemoglobin: 11.9 g/dL (ref 11.1–15.9)
MCH: 28.7 pg (ref 26.6–33.0)
MCHC: 32.5 g/dL (ref 31.5–35.7)
MCV: 88 fL (ref 79–97)
Platelets: 235 10*3/uL (ref 150–450)
RBC: 4.14 x10E6/uL (ref 3.77–5.28)
RDW: 14.2 % (ref 11.7–15.4)
WBC: 7.3 10*3/uL (ref 3.4–10.8)

## 2020-09-01 LAB — MICROALBUMIN / CREATININE URINE RATIO
Creatinine, Urine: 75.9 mg/dL
Microalb/Creat Ratio: 4 mg/g creat (ref 0–29)
Microalbumin, Urine: 3.1 ug/mL

## 2020-09-01 LAB — HEMOGLOBIN A1C
Est. average glucose Bld gHb Est-mCnc: 148 mg/dL
Hgb A1c MFr Bld: 6.8 % — ABNORMAL HIGH (ref 4.8–5.6)

## 2020-09-01 MED ORDER — VALSARTAN-HYDROCHLOROTHIAZIDE 320-25 MG PO TABS: 0.5000 | ORAL_TABLET | Freq: Every day | ORAL | 1 refills | Status: DC

## 2020-09-01 MED ORDER — ROSUVASTATIN CALCIUM 20 MG PO TABS: ORAL_TABLET | ORAL | 3 refills | Status: DC

## 2020-09-01 MED ORDER — KOMBIGLYZE XR 5-500 MG PO TB24: 1.0000 | ORAL_TABLET | Freq: Every day | ORAL | 3 refills | Status: DC

## 2020-09-01 MED ORDER — DILTIAZEM HCL ER COATED BEADS 300 MG PO CP24: ORAL_CAPSULE | ORAL | 3 refills | Status: DC

## 2020-09-01 MED ORDER — BUPROPION HCL ER (XL) 150 MG PO TB24: ORAL_TABLET | ORAL | 3 refills | Status: DC

## 2020-09-22 ENCOUNTER — Encounter: Payer: Self-pay | Admitting: Internal Medicine

## 2020-09-29 DIAGNOSIS — Z23 Encounter for immunization: Secondary | ICD-10-CM | POA: Diagnosis not present

## 2020-10-04 ENCOUNTER — Other Ambulatory Visit: Payer: Self-pay | Admitting: Medical

## 2020-10-14 DIAGNOSIS — Z23 Encounter for immunization: Secondary | ICD-10-CM | POA: Diagnosis not present

## 2020-11-09 ENCOUNTER — Encounter: Payer: Self-pay | Admitting: Internal Medicine

## 2020-11-09 DIAGNOSIS — Z5320 Procedure and treatment not carried out because of patient's decision for unspecified reasons: Secondary | ICD-10-CM | POA: Insufficient documentation

## 2020-12-15 ENCOUNTER — Other Ambulatory Visit: Payer: Self-pay | Admitting: Medical

## 2020-12-15 DIAGNOSIS — Z1231 Encounter for screening mammogram for malignant neoplasm of breast: Secondary | ICD-10-CM

## 2021-01-13 ENCOUNTER — Other Ambulatory Visit: Payer: Self-pay

## 2021-01-13 ENCOUNTER — Ambulatory Visit (INDEPENDENT_AMBULATORY_CARE_PROVIDER_SITE_OTHER): Payer: Medicare Other | Admitting: Medical

## 2021-01-13 VITALS — BP 120/70 | HR 67 | Wt 185.6 lb

## 2021-01-13 DIAGNOSIS — I10 Essential (primary) hypertension: Secondary | ICD-10-CM

## 2021-01-13 DIAGNOSIS — R7989 Other specified abnormal findings of blood chemistry: Secondary | ICD-10-CM | POA: Insufficient documentation

## 2021-01-13 DIAGNOSIS — F325 Major depressive disorder, single episode, in full remission: Secondary | ICD-10-CM

## 2021-01-13 DIAGNOSIS — E1169 Type 2 diabetes mellitus with other specified complication: Secondary | ICD-10-CM

## 2021-01-13 DIAGNOSIS — E785 Hyperlipidemia, unspecified: Secondary | ICD-10-CM

## 2021-01-13 DIAGNOSIS — E118 Type 2 diabetes mellitus with unspecified complications: Secondary | ICD-10-CM

## 2021-01-13 NOTE — Progress Notes (Signed)
Subjective:   Chief Complaint  Patient presents with   diabetes    Diabetes    Current Health Care Team: Dentist Eye doctor, Dr. Blima Dessert Dr. Coral Ceo, gynecology   Diabetes - fasting glucose running normal fasting.  No foot concerns.  No low readings.   Taking Kombiglyze XR 5/500mg  daily .  Meter recently quit working, so she has to get a replacement soon.    HTN - compliant with Atenolol 50mg  daily, Diltiazem 300mg  daily, 1/2 tablet of Valsartan HCT 320/25mg  or 160/12.5mg  daily.   No chest pain, no palpation, no edema.    Hyperlipidemia - compliant with Crestor 20mg  daily.  History of depression in remission - doing fine on current therapy   Past Medical History:  Diagnosis Date   Depression    Diabetes mellitus    Former smoker    Hyperlipidemia    Hypertension    Osteopenia    2016 bone density, normal appearing bone density 2019   Routine gynecological examination    Dr.   Vitamin D deficiency    Wears dentures    Wears glasses    Current Outpatient Medications on File Prior to Visit  Medication Sig Dispense Refill   atenolol (TENORMIN) 50 MG tablet TAKE 1 TABLET(50 MG) BY MOUTH DAILY 90 tablet 3   buPROPion (WELLBUTRIN XL) 150 MG 24 hr tablet TAKE 1 TABLET(150 MG) BY MOUTH DAILY 90 tablet 3   co-enzyme Q-10 50 MG capsule Take 1 capsule (50 mg total) by mouth daily. 90 capsule 3   diltiazem (CARDIZEM CD) 300 MG 24 hr capsule TAKE 1 CAPSULE(300 MG) BY MOUTH DAILY 90 capsule 3   fish oil-omega-3 fatty acids 1000 MG capsule Take 1 g by mouth daily.       rosuvastatin (CRESTOR) 20 MG tablet TAKE 1 TABLET(20 MG) BY MOUTH AT BEDTIME 90 tablet 0   Saxagliptin-Metformin (KOMBIGLYZE XR) 5-500 MG TB24 Take 1 tablet by mouth daily. 90 tablet 3   sertraline (ZOLOFT) 100 MG tablet TAKE HALF TABLET BY MOUTH EVERY DAY 90 tablet 0   valsartan-hydrochlorothiazide (DIOVAN-HCT) 320-25 MG tablet Take 0.5 tablets by mouth daily. 90 tablet 1   vitamin E 200 UNIT  capsule Take 200 Units by mouth daily.     No current facility-administered medications on file prior to visit.    ROS as in subjective    Objective:   BP 120/70   Pulse 67   Wt 185 lb 9.6 oz (84.2 kg)   LMP  (LMP Unknown)   BMI 29.07 kg/m    BP Readings from Last 3 Encounters:  01/13/21 120/70  08/31/20 128/82  04/13/20 (!) 166/84   Wt Readings from Last 3 Encounters:  01/13/21 185 lb 9.6 oz (84.2 kg)  08/31/20 185 lb 3.2 oz (84 kg)  04/13/20 186 lb 9.6 oz (84.6 kg)   Gen: wd, wn, nad Heart: RRR, normal S1, S2, no murmurs Lungs: CTA bilaterally, no wheezes, rhonchi, or rales Extremities: no edema, no cyanosis, no clubbing Pulses: 2+ symmetric, upper and lower extremities, normal cap refill Psychiatric: normal affect, behavior normal, pleasant   Diabetic Foot Exam - Simple   Simple Foot Form Diabetic Foot exam was performed with the following findings: Yes 01/13/2021  2:15 PM  Visual Inspection No deformities, no ulcerations, no other skin breakdown bilaterally: Yes Sensation Testing Intact to touch and monofilament testing bilaterally: Yes Pulse Check Posterior Tibialis and Dorsalis pulse intact bilaterally: Yes Comments       Assessment:  Encounter Diagnoses  Name Primary?   Essential hypertension Yes   Diabetes mellitus with complication (HCC)    Hyperlipidemia associated with type 2 diabetes mellitus (HCC)    Elevated serum creatinine    Depression, major, in remission (HCC)     Plan:   Hypertension - c/t current medications, routine labs today  Diabetes type 2- Continue healthy diet, regular exercise, daily foot checks, yearly eye doctor visits, routine follow-up  Hyperlipidemia associated with diabetes -continue statin  Elevated creatinine last visit - updated labs today  Depression in remission - she wants to continue current therapy   Brenda [name] was seen today for diabetes.  Diagnoses and all orders for this visit:  Essential  hypertension  Diabetes mellitus with complication (HCC) -     Hemoglobin A1c -     Basic metabolic panel  Hyperlipidemia associated with type 2 diabetes mellitus (HCC)  Elevated serum creatinine -     Basic metabolic panel -     Urinalysis, Routine w reflex microscopic  Depression, major, in remission (HCC)   Follow-up pending labs

## 2021-01-14 ENCOUNTER — Other Ambulatory Visit: Payer: Self-pay | Admitting: Medical

## 2021-01-14 LAB — BASIC METABOLIC PANEL
BUN/Creatinine Ratio: 14 (ref 12–28)
BUN: 14 mg/dL (ref 8–27)
CO2: 24 mmol/L (ref 20–29)
Calcium: 10.1 mg/dL (ref 8.7–10.3)
Chloride: 102 mmol/L (ref 96–106)
Creatinine, Ser: 0.97 mg/dL (ref 0.57–1.00)
Glucose: 83 mg/dL (ref 70–99)
Potassium: 4.1 mmol/L (ref 3.5–5.2)
Sodium: 138 mmol/L (ref 134–144)
eGFR: 62 mL/min/{1.73_m2} (ref >59)

## 2021-01-14 LAB — URINALYSIS, ROUTINE W REFLEX MICROSCOPIC
Bilirubin, UA: NEGATIVE
Glucose, UA: NEGATIVE
Ketones, UA: NEGATIVE
Nitrite, UA: NEGATIVE
Protein,UA: NEGATIVE
RBC, UA: NEGATIVE
Specific Gravity, UA: 1.008 (ref 1.005–1.030)
Urobilinogen, Ur: 0.2 mg/dL (ref 0.2–1.0)
pH, UA: 6 (ref 5.0–7.5)

## 2021-01-14 LAB — MICROSCOPIC EXAMINATION
Casts: NONE SEEN /lpf
RBC, Urine: NONE SEEN /hpf (ref 0–2)

## 2021-01-14 LAB — HEMOGLOBIN A1C
Est. average glucose Bld gHb Est-mCnc: 154 mg/dL
Hgb A1c MFr Bld: 7 % — ABNORMAL HIGH (ref 4.8–5.6)

## 2021-01-14 MED ORDER — ROSUVASTATIN CALCIUM 20 MG PO TABS: 20.0000 mg | ORAL_TABLET | Freq: Every day | ORAL | 3 refills | Status: DC

## 2021-01-14 MED ORDER — SERTRALINE HCL 100 MG PO TABS: ORAL_TABLET | ORAL | 3 refills | Status: DC

## 2021-01-14 MED ORDER — VALSARTAN-HYDROCHLOROTHIAZIDE 160-12.5 MG PO TABS: 1.0000 | ORAL_TABLET | Freq: Every day | ORAL | 3 refills | Status: DC

## 2021-01-18 ENCOUNTER — Ambulatory Visit
Admission: RE | Admit: 2021-01-18 | Discharge: 2021-01-18 | Disposition: A | Discharge status: Home / Self Care | Payer: Medicare Other | Source: Ambulatory Visit | Attending: Medical | Admitting: Medical

## 2021-01-18 DIAGNOSIS — Z1231 Encounter for screening mammogram for malignant neoplasm of breast: Secondary | ICD-10-CM | POA: Diagnosis not present

## 2021-01-31 ENCOUNTER — Other Ambulatory Visit: Payer: Self-pay | Admitting: Medical

## 2021-02-02 DIAGNOSIS — H5203 Hypermetropia, bilateral: Secondary | ICD-10-CM | POA: Diagnosis not present

## 2021-02-02 DIAGNOSIS — H2513 Age-related nuclear cataract, bilateral: Secondary | ICD-10-CM | POA: Diagnosis not present

## 2021-02-02 DIAGNOSIS — E119 Type 2 diabetes mellitus without complications: Secondary | ICD-10-CM | POA: Diagnosis not present

## 2021-02-02 DIAGNOSIS — H524 Presbyopia: Secondary | ICD-10-CM | POA: Diagnosis not present

## 2021-02-02 DIAGNOSIS — H52223 Regular astigmatism, bilateral: Secondary | ICD-10-CM | POA: Diagnosis not present

## 2021-02-02 LAB — HM DIABETES EYE EXAM

## 2021-02-18 ENCOUNTER — Encounter: Payer: Self-pay | Admitting: Internal Medicine

## 2021-04-05 ENCOUNTER — Telehealth: Payer: Self-pay | Admitting: Medical

## 2021-04-05 IMAGING — MG DIGITAL SCREENING BILAT W/ TOMO W/ CAD
8 series · 8 of 24 positions shown · non-contrast
Comparison: Previous exam(s).

CLINICAL DATA: Screening.

EXAM:
DIGITAL SCREENING BILATERAL MAMMOGRAM WITH TOMO AND CAD

[L MLO synth-2D]
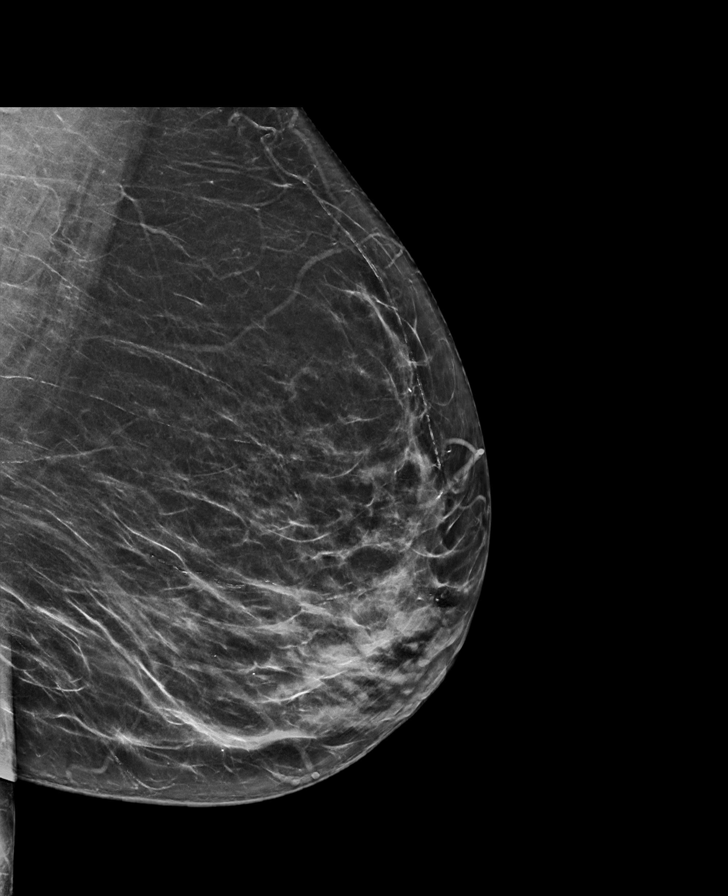

[R CC synth-2D]
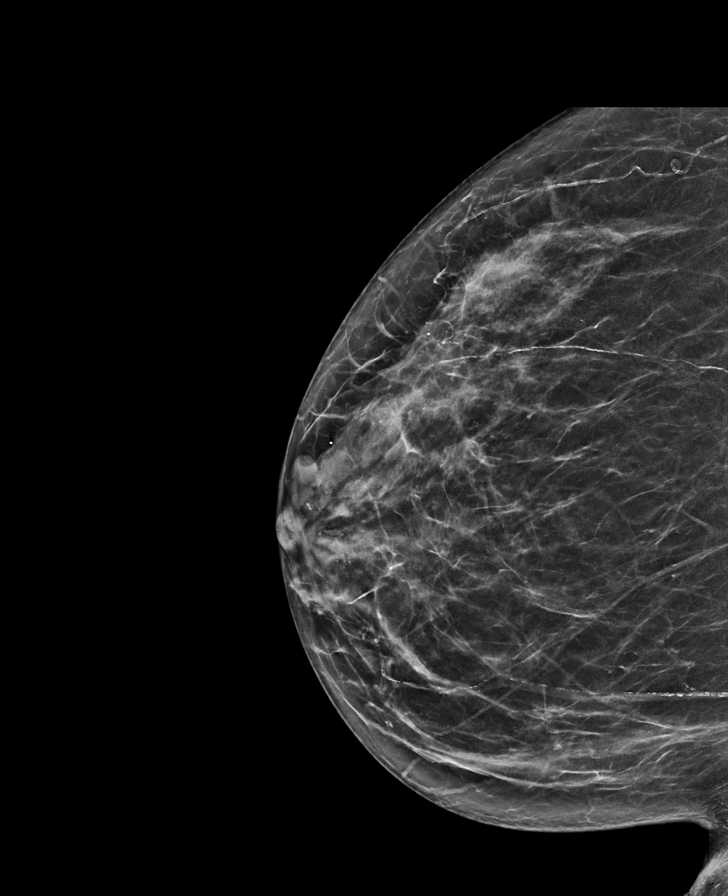

[R MLO synth-2D]
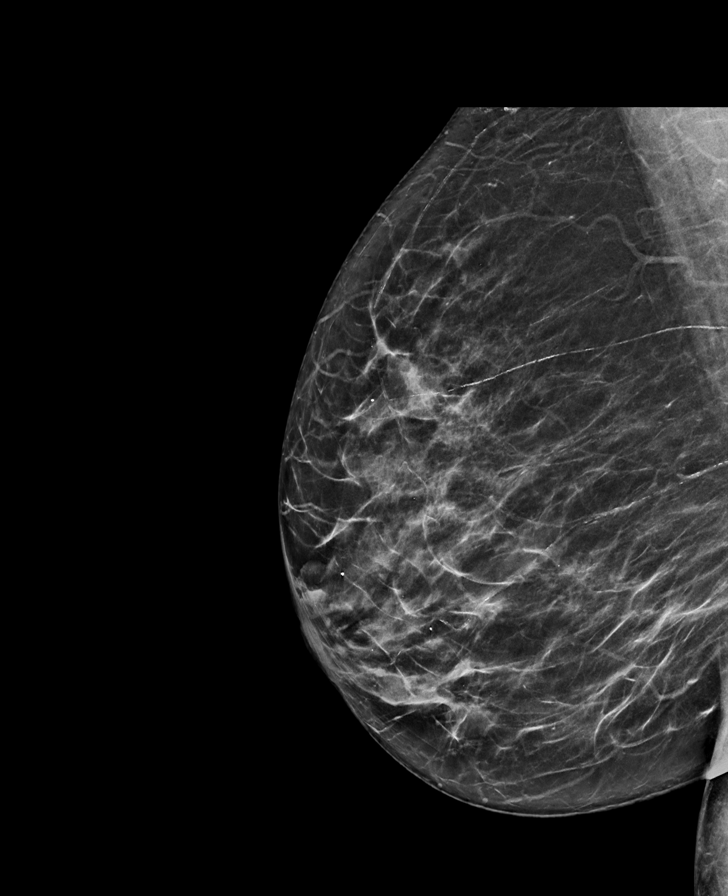

[L CC synth-2D]
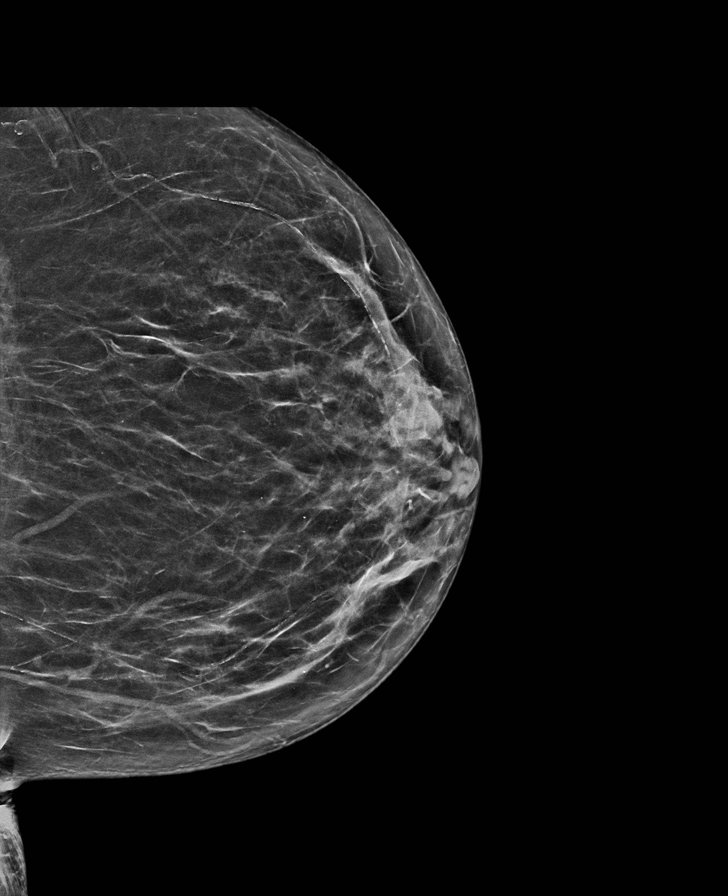

[L CC tomo · tomo slice 35/68.0]
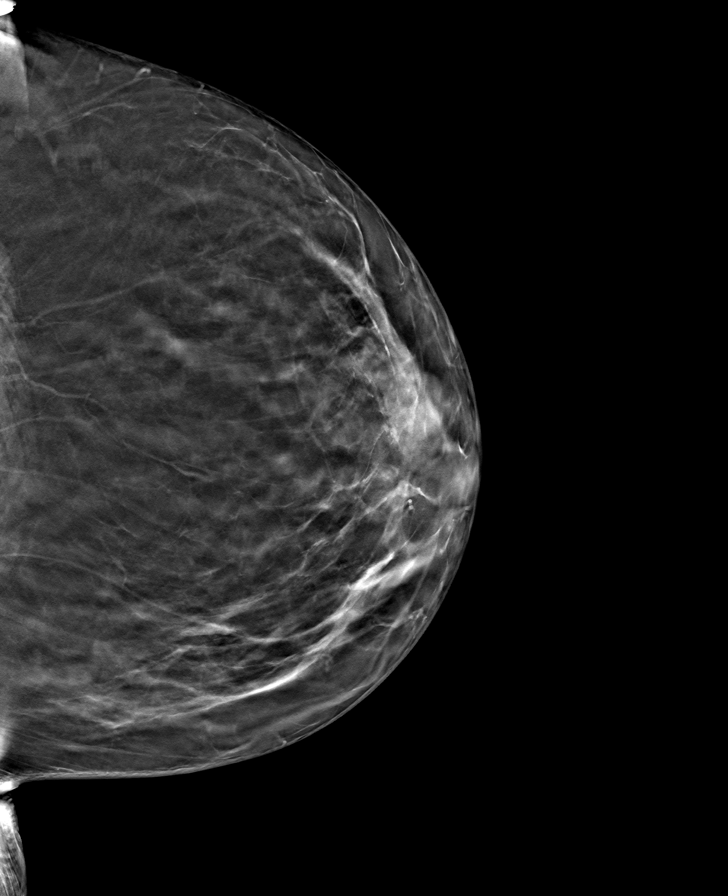

[R CC tomo · tomo slice 33/66.0]
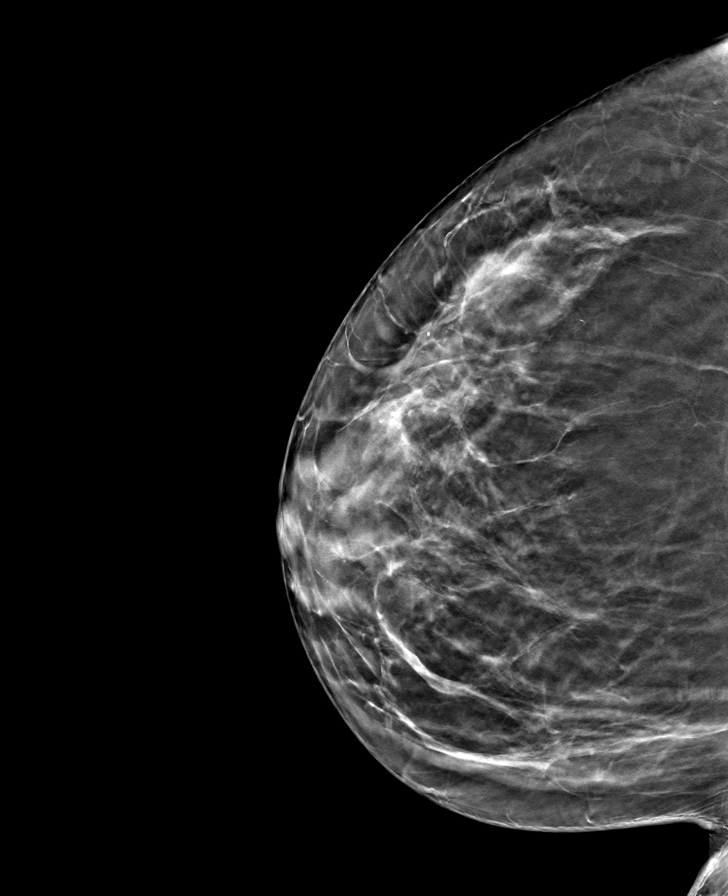

[L MLO tomo · tomo slice 37/72.0]
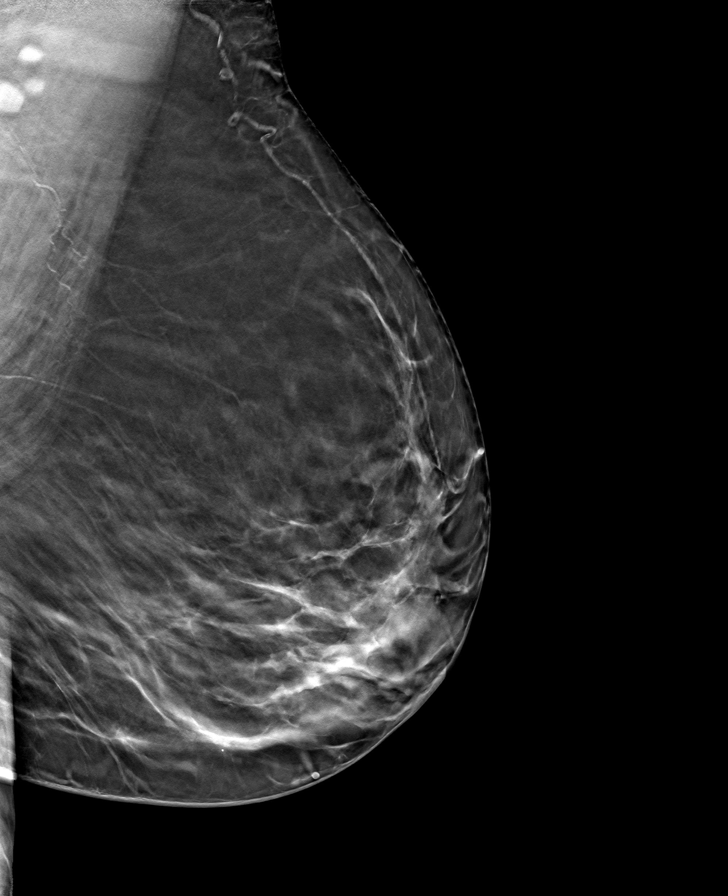

[R MLO tomo · tomo slice 35/70.0]
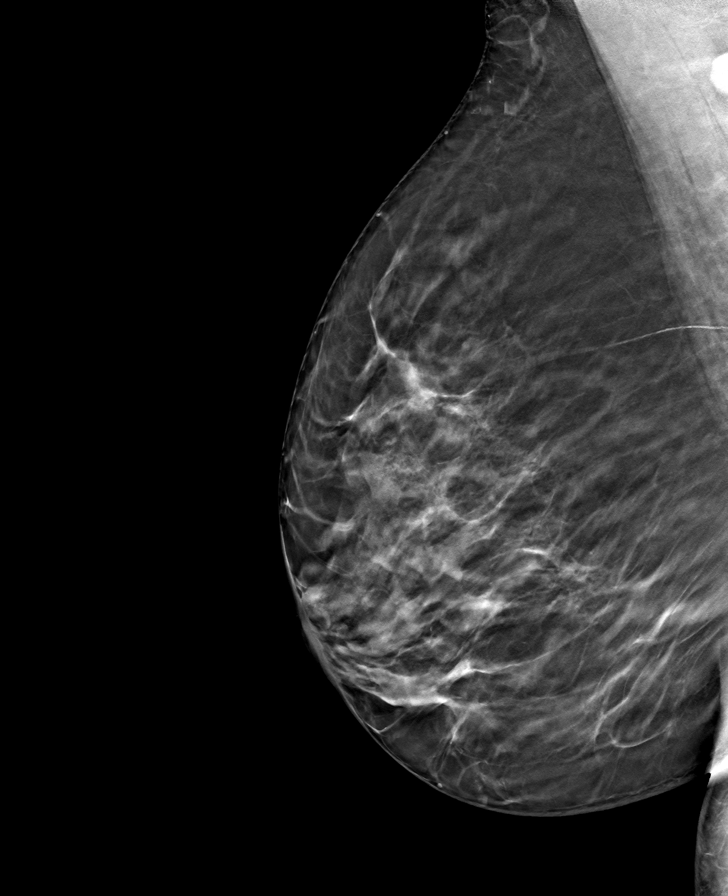

[8 of 24 positions shown; findings below may reference images not displayed]

ACR Breast Density Category c: The breast tissue is heterogeneously
dense, which may obscure small masses.
FINDINGS: There are no findings suspicious for malignancy. Images were
processed with CAD.
IMPRESSION: No mammographic evidence of malignancy. A result letter of this
screening mammogram will be mailed directly to the patient.

RECOMMENDATION:
Screening mammogram in one year. (Code:[id number])

BI-RADS CATEGORY  1: Negative.

## 2021-04-05 NOTE — Telephone Encounter (Signed)
Spoke with patient to schedule Medicare Annual Wellness Visit (AWV) either virtually or in office.  Patient declined.  She did want to schedule with [facility]    Last AWV ;04/13/20 please schedule at anytime with health coach  This should be a 45 minute visit.

## 2021-05-02 ENCOUNTER — Other Ambulatory Visit: Payer: Self-pay

## 2021-05-02 ENCOUNTER — Ambulatory Visit (INDEPENDENT_AMBULATORY_CARE_PROVIDER_SITE_OTHER): Payer: Medicare Other

## 2021-05-02 ENCOUNTER — Ambulatory Visit (HOSPITAL_COMMUNITY)
Admission: EM | Admit: 2021-05-02 | Discharge: 2021-05-02 | Disposition: A | Discharge status: Home / Self Care | Payer: Medicare Other | Attending: Sports Medicine | Admitting: Sports Medicine

## 2021-05-02 ENCOUNTER — Encounter (HOSPITAL_COMMUNITY): Payer: Self-pay | Admitting: *Deleted

## 2021-05-02 DIAGNOSIS — S161XXA Strain of muscle, fascia and tendon at neck level, initial encounter: Secondary | ICD-10-CM

## 2021-05-02 DIAGNOSIS — M19012 Primary osteoarthritis, left shoulder: Secondary | ICD-10-CM | POA: Diagnosis not present

## 2021-05-02 DIAGNOSIS — M542 Cervicalgia: Secondary | ICD-10-CM | POA: Diagnosis not present

## 2021-05-02 DIAGNOSIS — S46812A Strain of other muscles, fascia and tendons at shoulder and upper arm level, left arm, initial encounter: Secondary | ICD-10-CM

## 2021-05-02 DIAGNOSIS — M25512 Pain in left shoulder: Secondary | ICD-10-CM

## 2021-05-02 DIAGNOSIS — I10 Essential (primary) hypertension: Secondary | ICD-10-CM

## 2021-05-02 MED ORDER — METHOCARBAMOL 500 MG PO TABS: 500.0000 mg | ORAL_TABLET | Freq: Two times a day (BID) | ORAL | 0 refills | Status: DC | PRN

## 2021-05-02 NOTE — Discharge Instructions (Addendum)
Alternate heat and ice for 20-mins at a time, do this 3x daily ?May use Tylenol for pain control ? ?Will provide Rx for Robaxin to be taken twice daily as needed for muscle spasm/tightness --> be sure not to drive or operate machinery after taking this in case makes you drowzy ? ?Be sure to follow-up with your PCP for your blood pressure. Resume your medications today. ?
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
## 2021-06-10 ENCOUNTER — Encounter: Payer: Self-pay | Admitting: Medical

## 2021-06-10 ENCOUNTER — Ambulatory Visit (INDEPENDENT_AMBULATORY_CARE_PROVIDER_SITE_OTHER): Payer: Medicare Other | Admitting: Medical

## 2021-06-10 VITALS — BP 122/80 | HR 65 | Wt 182.6 lb

## 2021-06-10 DIAGNOSIS — E1169 Type 2 diabetes mellitus with other specified complication: Secondary | ICD-10-CM

## 2021-06-10 DIAGNOSIS — E785 Hyperlipidemia, unspecified: Secondary | ICD-10-CM | POA: Diagnosis not present

## 2021-06-10 DIAGNOSIS — E118 Type 2 diabetes mellitus with unspecified complications: Secondary | ICD-10-CM

## 2021-06-10 DIAGNOSIS — Z7185 Encounter for immunization safety counseling: Secondary | ICD-10-CM

## 2021-06-10 DIAGNOSIS — F325 Major depressive disorder, single episode, in full remission: Secondary | ICD-10-CM | POA: Diagnosis not present

## 2021-06-10 DIAGNOSIS — I10 Essential (primary) hypertension: Secondary | ICD-10-CM

## 2021-06-10 LAB — POCT GLYCOSYLATED HEMOGLOBIN (HGB A1C): Hemoglobin A1C: 6.6 % — AB (ref 4.0–5.6)

## 2021-06-10 NOTE — Assessment & Plan Note (Addendum)
Continue saxagliptin metformin 5/500 mg daily.  Hemoglobin A1c at goal.  I reviewed routine labs done within the past year.  Continue healthy diet and regular exercise.  Continue glucose monitoring.  Has thicker socks and regular monitoring of her feet to make sure no ulceration forming of the small toes ?

## 2021-06-10 NOTE — Assessment & Plan Note (Signed)
Shingles vaccine:  I recommend you have a shingles vaccine to help prevent shingles or herpes zoster outbreak.   Please call your insurer to inquire about coverage for the Shingrix vaccine given in 2 doses.   Some insurers cover this vaccine after age 72, some cover this after age 60.  If your insurer covers this, then call to schedule appointment to have this vaccine here.  

## 2021-06-10 NOTE — Assessment & Plan Note (Signed)
Stable on current therapy.  Continue atenolol 50 mg daily, diltiazem 300 mg daily, valsartan HCT 160/12.5 mg daily ?

## 2021-06-10 NOTE — Progress Notes (Signed)
Subjective: ? Brenda [name] is a 71 y.o. female who presents for ?Chief Complaint  ?Patient presents with  ? diabetes  ?  Diabetes check and bp check, no concerns  ?   ?Here for routine med check. ? ?Hypertension ?Pertinent negatives include no chest pain, palpitations or peripheral edema. There are no compliance problems (Compliant with current medication.  Home blood pressures look normal.  No symptoms of concern.).   ?Diabetes ?Pertinent negatives for diabetes include no chest pain, no fatigue, no foot paresthesias, no polydipsia and no polyuria. She is compliant with treatment all of the time.  ?Hyperlipidemia ?Pertinent negatives include no chest pain. Current antihyperlipidemic treatment includes statins and exercise. There are no compliance problems.   ?Depression ?       Associated symptoms include no decreased concentration, no fatigue and no decreased interest.  Past treatments include SSRIs - Selective serotonin reuptake inhibitors. ? ?No other aggravating or relieving factors.   ? ?No other c/o. ? ?Past Medical History:  ?Diagnosis Date  ? Depression   ? Diabetes mellitus   ? Former smoker   ? Hyperlipidemia   ? Hypertension   ? Osteopenia   ? 2016 bone density, normal appearing bone density 2019  ? Routine gynecological examination   ? Dr. Gaynell Face  ? Vitamin D deficiency   ? Wears dentures   ? Wears glasses   ? ?Current Outpatient Medications on File Prior to Visit  ?Medication Sig Dispense Refill  ? atenolol (TENORMIN) 50 MG tablet TAKE 1 TABLET(50 MG) BY MOUTH DAILY 90 tablet 3  ? buPROPion (WELLBUTRIN XL) 150 MG 24 hr tablet TAKE 1 TABLET(150 MG) BY MOUTH DAILY 90 tablet 3  ? co-enzyme Q-10 50 MG capsule Take 1 capsule (50 mg total) by mouth daily. 90 capsule 3  ? diltiazem (CARDIZEM CD) 300 MG 24 hr capsule TAKE 1 CAPSULE(300 MG) BY MOUTH DAILY 90 capsule 3  ? fish oil-omega-3 fatty acids 1000 MG capsule Take 1 g by mouth daily.      ? rosuvastatin (CRESTOR) 20 MG tablet Take 1 tablet (20  mg total) by mouth daily. 90 tablet 3  ? Saxagliptin-Metformin (KOMBIGLYZE XR) 5-500 MG TB24 Take 1 tablet by mouth daily. 90 tablet 3  ? sertraline (ZOLOFT) 100 MG tablet TAKE HALF TABLET BY MOUTH EVERY DAY 90 tablet 3  ? valsartan-hydrochlorothiazide (DIOVAN HCT) 160-12.5 MG tablet Take 1 tablet by mouth daily. 90 tablet 3  ? vitamin E 200 UNIT capsule Take 200 Units by mouth daily.    ? ?No current facility-administered medications on file prior to visit.  ? ? ? ?The following portions of the patient's history were reviewed and updated as appropriate: allergies, current medications, past family history, past medical history, past social history, past surgical history and problem list. ? ?ROS ?Otherwise as in subjective above ? ?Objective: ?BP 122/80   Pulse 65   Wt 182 lb 9.6 oz (82.8 kg)   LMP  (LMP Unknown)   BMI 28.60 kg/m?  ? ?General appearance: alert, no distress, well developed, well nourished ?Neck: supple, no lymphadenopathy, no thyromegaly, no masses ?Heart: RRR, normal S1, S2, no murmurs ?Lungs: CTA bilaterally, no wheezes, rhonchi, or rales ?Pulses: 2+ radial pulses, 2+ pedal pulses, normal cap refill ?Ext: no edema ? ?Diabetic Foot Exam - Simple   ?Simple Foot Form ?Diabetic Foot exam was performed with the following findings: Yes 06/10/2021  1:16 PM  ?Visual Inspection ?See comments: Yes ?Sensation Testing ?Intact to touch and monofilament testing  bilaterally: Yes ?Pulse Check ?Posterior Tibialis and Dorsalis pulse intact bilaterally: Yes ?Comments ?Dorsal aspect of her fifth toe bilaterally middle phalanx with callus ?  ? ? ?Assessment: ?Encounter Diagnoses  ?Name Primary?  ? Diabetes mellitus with complication (HCC) Yes  ? Essential hypertension   ? Hyperlipidemia associated with type 2 diabetes mellitus (HCC)   ? Depression, major, in remission (HCC)   ? Vaccine counseling   ? ? ? ?Plan: ?Problem List Items Addressed This Visit   ? ? Essential hypertension  ?  Stable on current therapy.   Continue atenolol 50 mg daily, diltiazem 300 mg daily, valsartan HCT 160/12.5 mg daily ? ?  ?  ? Depression, major, in remission (HCC)  ?  Doing fine on current therapy, sertraline 100 mg daily. ? ?  ?  ? Diabetes mellitus with complication (HCC) - Primary  ?  Continue saxagliptin metformin 5/500 mg daily.  Hemoglobin A1c at goal.  I reviewed routine labs done within the past year.  Continue healthy diet and regular exercise.  Continue glucose monitoring.  Has thicker socks and regular monitoring of her feet to make sure no ulceration forming of the small toes ? ?  ?  ? Relevant Orders  ? HgB A1c (Completed)  ? Vaccine counseling  ?  Shingles vaccine:  I recommend you have a shingles vaccine to help prevent shingles or herpes zoster outbreak.   Please call your insurer to inquire about coverage for the Shingrix vaccine given in 2 doses.   Some insurers cover this vaccine after age 26, some cover this after age 33.  If your insurer covers this, then call to schedule appointment to have this vaccine here. ? ? ?  ?  ? Hyperlipidemia associated with type 2 diabetes mellitus (HCC)  ?  Continue rosuvastatin 20 mg daily and fish oil 1000 mg twice daily.  Last lipid labs at goal within the past year ? ?  ?  ? ?Reviewed labs from December 2022 and July 2022. ? ?Follow up: 26mo for fasting well visit ? ? ? ? ? ?

## 2021-06-10 NOTE — Assessment & Plan Note (Signed)
Doing fine on current therapy, sertraline 100 mg daily. ?

## 2021-06-10 NOTE — Assessment & Plan Note (Signed)
Continue rosuvastatin 20 mg daily and fish oil 1000 mg twice daily.  Last lipid labs at goal within the past year ?

## 2021-08-09 ENCOUNTER — Other Ambulatory Visit: Payer: Self-pay | Admitting: Medical

## 2021-08-09 DIAGNOSIS — I10 Essential (primary) hypertension: Secondary | ICD-10-CM

## 2021-09-19 ENCOUNTER — Other Ambulatory Visit: Payer: Self-pay | Admitting: Medical

## 2021-09-19 NOTE — Telephone Encounter (Signed)
[facility] is requesting to fill pt wellbutrin please advise [facility]

## 2021-10-10 ENCOUNTER — Other Ambulatory Visit: Payer: Self-pay | Admitting: Medical

## 2021-10-19 ENCOUNTER — Encounter: Payer: Self-pay | Admitting: Internal Medicine

## 2021-11-17 ENCOUNTER — Encounter: Payer: Self-pay | Admitting: Medical

## 2021-11-17 ENCOUNTER — Ambulatory Visit (INDEPENDENT_AMBULATORY_CARE_PROVIDER_SITE_OTHER): Payer: Medicare Other | Admitting: Medical

## 2021-11-17 VITALS — BP 120/70 | HR 56 | Ht 67.0 in | Wt 178.2 lb

## 2021-11-17 DIAGNOSIS — E118 Type 2 diabetes mellitus with unspecified complications: Secondary | ICD-10-CM

## 2021-11-17 DIAGNOSIS — Z5320 Procedure and treatment not carried out because of patient's decision for unspecified reasons: Secondary | ICD-10-CM | POA: Diagnosis not present

## 2021-11-17 DIAGNOSIS — Z23 Encounter for immunization: Secondary | ICD-10-CM

## 2021-11-17 DIAGNOSIS — E2839 Other primary ovarian failure: Secondary | ICD-10-CM | POA: Diagnosis not present

## 2021-11-17 DIAGNOSIS — Z0000 Encounter for general adult medical examination without abnormal findings: Secondary | ICD-10-CM | POA: Diagnosis not present

## 2021-11-17 DIAGNOSIS — I10 Essential (primary) hypertension: Secondary | ICD-10-CM | POA: Diagnosis not present

## 2021-11-17 DIAGNOSIS — Z7185 Encounter for immunization safety counseling: Secondary | ICD-10-CM | POA: Diagnosis not present

## 2021-11-17 DIAGNOSIS — E559 Vitamin D deficiency, unspecified: Secondary | ICD-10-CM | POA: Diagnosis not present

## 2021-11-17 DIAGNOSIS — F325 Major depressive disorder, single episode, in full remission: Secondary | ICD-10-CM

## 2021-11-17 DIAGNOSIS — E1169 Type 2 diabetes mellitus with other specified complication: Secondary | ICD-10-CM | POA: Diagnosis not present

## 2021-11-17 DIAGNOSIS — F411 Generalized anxiety disorder: Secondary | ICD-10-CM | POA: Diagnosis not present

## 2021-11-17 DIAGNOSIS — Z780 Asymptomatic menopausal state: Secondary | ICD-10-CM

## 2021-11-17 DIAGNOSIS — E785 Hyperlipidemia, unspecified: Secondary | ICD-10-CM | POA: Diagnosis not present

## 2021-11-17 NOTE — Patient Instructions (Signed)
This visit was a preventative care visit, also known as wellness visit or routine physical.   Topics typically include healthy lifestyle, diet, exercise, preventative care, vaccinations, sick and well care, proper use of emergency dept and after hours care, as well as other concerns.     Recommendations: Continue to return yearly for your annual wellness and preventative care visits.  This gives [place] a chance to discuss healthy lifestyle, exercise, vaccinations, review your chart record, and perform screenings where appropriate.  I recommend you see your eye doctor yearly for routine vision care.  I recommend you see your dentist yearly for routine dental care including hygiene visits twice yearly.   Vaccination recommendations were reviewed Immunization History  Administered Date(s) Administered   Fluad Quad(high Dose 65+) 10/07/2018, 12/03/2019   Influenza Split 11/05/2013, 11/01/2014   Influenza, High Dose Seasonal PF 10/30/2016, 12/05/2017   Influenza-Unspecified 12/20/2015, 10/19/2020   Moderna Sars-Covid-2 Vaccination 02/15/2019, 03/15/2019, 12/03/2019   PPD Test 09/30/2008   Pneumococcal Conjugate-13 11/30/2008   Pneumococcal Polysaccharide-23 12/23/2010, 04/13/2020   Tdap 10/03/2011, 11/01/2014   Zoster, Live 07/27/2012    We will get copy of Shingrix info from [facility]  Counseled on the influenza virus vaccine.  Vaccine information sheet given.   High dose Influenza vaccine given after consent obtained.   Screening for cancer: Colon cancer screening: Declines colonoscopy or colon cancer screening  Breast cancer screening: You should perform a self breast exam monthly.   We reviewed recommendations for regular mammograms and breast cancer screenin per USPSTF guidelines.   Skin cancer screening: Check your skin regularly for new changes, growing lesions, or other lesions of concern Come in for evaluation if you have skin lesions of concern.  Lung cancer  screening: If you have a greater than 20 pack year history of tobacco use, then you may qualify for lung cancer screening with a chest CT scan.   Please call your insurance company to inquire about coverage for this test.  We currently don't have screenings for other cancers besides breast, cervical, colon, and lung cancers.  If you have a strong family history of cancer or have other cancer screening concerns, please let me know.    Bone health: Get at least 150 minutes of aerobic exercise weekly Get weight bearing exercise at least once weekly Bone density test:  A bone density test is an imaging test that uses a type of X-ray to measure the amount of calcium and other minerals in your bones. The test may be used to diagnose or screen you for a condition that causes weak or thin bones (osteoporosis), predict your risk for a broken bone (fracture), or determine how well your osteoporosis treatment is working. The bone density test is recommended for females 65 and older, or females or males <65 if certain risk factors such as thyroid disease, long term use of steroids such as for asthma or rheumatological issues, vitamin D deficiency, estrogen deficiency, family history of osteoporosis, self or family history of fragility fracture in first degree relative.  Bone density 2019 normal.  Plan repeat possibly 2024.  Heart health: Get at least 150 minutes of aerobic exercise weekly Limit alcohol It is important to maintain a healthy blood pressure and healthy cholesterol numbers  Heart disease screening: Screening for heart disease includes screening for blood pressure, fasting lipids, glucose/diabetes screening, BMI height to weight ratio, reviewed of smoking status, physical activity, and diet.    Goals include blood pressure 120/80 or less, maintaining a healthy lipid/cholesterol profile,  preventing diabetes or keeping diabetes numbers under good control, not smoking or using tobacco products,  exercising most days per week or at least 150 minutes per week of exercise, and eating healthy variety of fruits and vegetables, healthy oils, and avoiding unhealthy food choices like fried food, fast food, high sugar and high cholesterol foods.    Other tests may possibly include EKG test, CT coronary calcium score, echocardiogram, exercise treadmill stress test.   I recommend baseline echo and CT coronary.  She declines    Medical care options: I recommend you continue to seek care here first for routine care.  We try really hard to have available appointments Monday through Friday daytime hours for sick visits, acute visits, and physicals.  Urgent care should be used for after hours and weekends for significant issues that cannot wait till the next day.  The emergency department should be used for significant potentially life-threatening emergencies.  The emergency department is expensive, can often have long wait times for less significant concerns, so try to utilize primary care, urgent care, or telemedicine when possible to avoid unnecessary trips to the emergency department.  Virtual visits and telemedicine have been introduced since the pandemic started in 2020, and can be convenient ways to receive medical care.  We offer virtual appointments as well to assist you in a variety of options to seek medical care.   Advanced Directives: I recommend you consider completing a [place] and Living Will.   These documents respect your wishes and help alleviate burdens on your loved ones if you were to become terminally ill or be in a position to need those documents enforced.    You can complete Advanced Directives yourself, have them notarized, then have copies made for our office, for you and for anybody you feel should have them in safe keeping.  Or, you can have an attorney prepare these documents.   If you haven't updated your Last Will and Testament in a while, it may be  worthwhile having an attorney prepare these documents together and save on some costs.      Separate specific issues: Continue current medications  Diabetes-she has prior used Invokana, Januvia, metformin, Kombiglyze Janumet.  She did not tolerate Invokana or Janumet.  She does well Kombiglyze.  Consider checking with insurance about Eugene Gavia or Colvin Caroli which are alternatives to [employer]

## 2021-11-17 NOTE — Progress Notes (Signed)
Subjective:    Brenda [name] is a 71 y.o. female who presents for Preventative Services visit and chronic medical problems/med check visit.    Chief Complaint  Patient presents with   fasting cpe, awv    Fasting cpe, awv no concerns. Would like flu shot    Primary Care Provider Tysinger, Camelia Eng, PA-C here for primary care   Current Health Care Team:  Patient Care Team: Tysinger, Camelia Eng, PA-C as PCP - General (Family Medicine) [place], [place] you may have received from other than [facility] providers in the past year (date may be approximate) See above  Exercise Current exercise habits:  yard work    Nutrition/Diet Current diet: in general, a "healthy" diet    Depression Screen    11/17/2021    8:23 AM  Depression screen PHQ 2/9  Decreased Interest 0  Down, Depressed, Hopeless 0  PHQ - 2 Score 0    Activities of Daily Living Screen/Functional Status Survey Is the patient deaf or have difficulty hearing?: No Does the patient have difficulty seeing, even when wearing glasses/contacts?: No Does the patient have difficulty concentrating, remembering, or making decisions?: Yes Does the patient have difficulty walking or climbing stairs?: No Does the patient have difficulty dressing or bathing?: No Does the patient have difficulty doing errands alone such as visiting a doctor's office or shopping?: No  Can patient draw a clock face showing 3:15 oclock, yes  Fall Risk Screen    11/17/2021    8:22 AM 06/10/2021   11:48 AM 01/13/2021    1:58 PM 08/31/2020    2:15 PM 04/13/2020    1:13 PM  [place] in the past year? 0 0 0 0 0  Number falls in past yr: 0 0 0 0   Injury with Fall? 0 0 0 0   Risk for fall due to : No Fall Risks No Fall Risks No Fall Risks  No Fall Risks  Follow up Falls evaluation completed Falls evaluation completed Falls evaluation completed  Falls evaluation completed    Gait  Assessment: Normal gait observed yes  Advanced directives Does patient have a [place]? Yes Does patient have a Living Will? Yes  Past Medical History:  Diagnosis Date   Depression    Diabetes mellitus    Former smoker    Hyperlipidemia    Hypertension    Osteopenia    2016 bone density, normal appearing bone density 2019   Routine gynecological examination    Dr. Ruthann Cancer   Vitamin D deficiency    Wears dentures    Wears glasses     Past Surgical History:  Procedure Laterality Date   CESAREAN SECTION  04/12/1983   COLONOSCOPY  2006   declines further colonoscopy    Social History   Socioeconomic History   Marital status: Widowed    Spouse name: Not on file   Number of children: Not on file   Years of education: Not on file   Highest education level: Not on file  Occupational History   Not on file  Tobacco Use   Smoking status: Former   Smokeless tobacco: Never  Substance and Sexual Activity   Alcohol use: No   Drug use: No   Sexual activity: Not Currently    Birth control/protection: Post-menopausal  Other Topics Concern   Not on file  Social History Narrative   Still works one day per  week.   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  Her daughter lives with her.  Former smoker.  11/2021     Social Determinants of Health   Financial Resource Strain: Not on file  Food Insecurity: Not on file  Transportation Needs: Not on file  Physical Activity: Not on file  Stress: Not on file  Social Connections: Not on file  Intimate Partner Violence: Not on file    Family History  Problem Relation Age of Onset   Heart disease Father        valvuloplasty   Hypertension Mother    Heart disease Mother        died at 53 with angioplasty complication   Hypertension Sister    Other Sister        complications of HIV   Hypertension Sister    Hypertension Sister      Current Outpatient Medications:     atenolol (TENORMIN) 50 MG tablet, TAKE 1 TABLET(50 MG) BY MOUTH DAILY, Disp: 90 tablet, Rfl: 0   buPROPion (WELLBUTRIN XL) 150 MG 24 hr tablet, TAKE 1 TABLET(150 MG) BY MOUTH DAILY, Disp: 90 tablet, Rfl: 0   co-enzyme Q-10 50 MG capsule, Take 1 capsule (50 mg total) by mouth daily., Disp: 90 capsule, Rfl: 3   diltiazem (CARDIZEM CD) 300 MG 24 hr capsule, TAKE 1 CAPSULE(300 MG) BY MOUTH DAILY, Disp: 90 capsule, Rfl: 1   fish oil-omega-3 fatty acids 1000 MG capsule, Take 1 g by mouth daily.  , Disp: , Rfl:    KOMBIGLYZE XR 5-500 MG TB24, TAKE 1 TABLET BY MOUTH DAILY, Disp: 90 tablet, Rfl: 0   rosuvastatin (CRESTOR) 20 MG tablet, Take 1 tablet (20 mg total) by mouth daily., Disp: 90 tablet, Rfl: 3   sertraline (ZOLOFT) 100 MG tablet, TAKE HALF TABLET BY MOUTH EVERY DAY, Disp: 90 tablet, Rfl: 3   valsartan-hydrochlorothiazide (DIOVAN-HCT) 320-25 MG tablet, TAKE HALF TABLET BY MOUTH EVERY DAY, Disp: 90 tablet, Rfl: 1   vitamin E 200 UNIT capsule, Take 200 Units by mouth daily., Disp: , Rfl:   Allergies  Allergen Reactions   Janumet Xr [Sitagliptin-Metformin Hcl Er]     Bad headaches   Metformin And Related     Nausea, anorexia   Nuprin [Ibuprofen] Hives    Can tolerate generic Ibuprofen    History reviewed: allergies, current medications, past family history, past medical history, past social history, past surgical history and problem list  Chronic issues discussed: Diabetes-compliant with Kombiglyze XR 5/500 mg daily but insurance is requiring updated prior Auth, glucose ranging 100-170.  Varies. But often ok.   Doesn't eat 3 consistent meals daily.  Doesn't often eat breafkast as she is not always hungry  Hypertension-compliant with atenolol 50 mg daily, valsartan HCT 320/25 mg daily and Cardizem 300 mg CD daily  Hyperlipidemia-compliant with rosuvastatin 20 mg daily  Mood is okay, still takes Zoloft 100 mg daily and Wellbutrin XL 150 mg daily  Acute issues discussed: none  Objective:     BP 120/70   Pulse (!) 56   Ht 5\' 7"  (1.702 m)   Wt 178 lb 3.2 oz (80.8 kg)   LMP  (LMP Unknown)   BMI 27.91 kg/m   Wt Readings from Last 3 Encounters:  11/17/21 178 lb 3.2 oz (80.8 kg)  06/10/21 182 lb 9.6 oz (82.8 kg)  01/13/21 185 lb 9.6 oz (84.2 kg)   BP Readings from Last 3 Encounters:  11/17/21 120/70  06/10/21 122/80  05/02/21 (!) 197/82     Cognitive Testing  Alert? Yes  Normal Appearance?Yes  Oriented to person? Yes  Place? Yes   Time? Yes  Recall of three objects?  Yes  Can perform simple calculations? Yes  Displays appropriate judgment?Yes  Can read the correct time from a watch face?Yes  General appearance: alert, no distress, WD/WN, African American female  Nutritional Status: Inadequate calore intake? no Loss of muscle mass? no Loss of fat beneath skin? no Localized or general edema? no Diminished functional status? no  Other pertinent exam: HEENT: normocephalic, sclerae anicteric, TMs pearly, nares patent, no discharge or erythema, pharynx normal Oral cavity: MMM, no lesions Neck: supple, no lymphadenopathy, no thyromegaly, no masse, no bruits Heart: RRR, normal S1, S2, no murmurs Lungs: CTA bilaterally, no wheezes, rhonchi, or rales Abdomen: +bs, soft, non tender, non distended, no masses, no hepatomegaly, no splenomegaly Musculoskeletal: nontender, no swelling, no obvious deformity Extremities: no edema, no cyanosis, no clubbing Pulses: 2+ symmetric, upper and lower extremities, normal cap refill Neurological: alert, oriented x 3, CN2-12 intact, strength normal upper extremities and lower extremities, sensation normal throughout, DTRs 2+ throughout, no cerebellar signs, gait normal Psychiatric: normal affect, behavior normal, pleasant  Breast/gyn - declined  Diabetic Foot Exam - Simple   Simple Foot Form Diabetic Foot exam was performed with the following findings: Yes 11/17/2021  8:56 AM  Visual Inspection See comments: Yes Sensation  Testing Intact to touch and monofilament testing bilaterally: Yes Pulse Check Posterior Tibialis and Dorsalis pulse intact bilaterally: Yes Comments Flat feet      Assessment:   Encounter Diagnoses  Name Primary?   Essential hypertension Yes   Medicare annual wellness visit, subsequent    Hyperlipidemia associated with type 2 diabetes mellitus (HCC)    Vitamin D deficiency    Vaccine counseling    Colonoscopy refused    Depression, major, in remission (HCC)    Diabetes mellitus with complication (HCC)    Estrogen deficiency    Generalized anxiety disorder    Need for immunization against influenza    Post-menopausal      Plan:    This visit was a preventative care visit, also known as wellness visit or routine physical.   Topics typically include healthy lifestyle, diet, exercise, preventative care, vaccinations, sick and well care, proper use of emergency dept and after hours care, as well as other concerns.     Recommendations: Continue to return yearly for your annual wellness and preventative care visits.  This gives [place] a chance to discuss healthy lifestyle, exercise, vaccinations, review your chart record, and perform screenings where appropriate.  I recommend you see your eye doctor yearly for routine vision care.  I recommend you see your dentist yearly for routine dental care including hygiene visits twice yearly.   Vaccination recommendations were reviewed Immunization History  Administered Date(s) Administered   Fluad Quad(high Dose 65+) 10/07/2018, 12/03/2019, 11/17/2021   Influenza Split 11/05/2013, 11/01/2014   Influenza, High Dose Seasonal PF 10/30/2016, 12/05/2017   Influenza-Unspecified 12/20/2015, 10/19/2020   Moderna Sars-Covid-2 Vaccination 02/15/2019, 03/15/2019, 12/03/2019   PPD Test 09/30/2008   Pneumococcal Conjugate-13 11/30/2008   Pneumococcal Polysaccharide-23 12/23/2010, 04/13/2020   Tdap 10/03/2011, 11/01/2014   Zoster, Live  07/27/2012    We will get copy of Shingrix info from [facility]  Counseled on the influenza virus vaccine.  Vaccine information sheet given.   High dose Influenza vaccine given after consent obtained.   Screening for cancer:  Colon cancer screening: Declines colonoscopy or colon cancer screening  Breast cancer screening: You should perform a self breast exam monthly.   We reviewed recommendations for regular mammograms and breast cancer screenin per USPSTF guidelines.   Skin cancer screening: Check your skin regularly for new changes, growing lesions, or other lesions of concern Come in for evaluation if you have skin lesions of concern.  Lung cancer screening: If you have a greater than 20 pack year history of tobacco use, then you may qualify for lung cancer screening with a chest CT scan.   Please call your insurance company to inquire about coverage for this test.  We currently don't have screenings for other cancers besides breast, cervical, colon, and lung cancers.  If you have a strong family history of cancer or have other cancer screening concerns, please let me know.    Bone health: Get at least 150 minutes of aerobic exercise weekly Get weight bearing exercise at least once weekly Bone density test:  A bone density test is an imaging test that uses a type of X-ray to measure the amount of calcium and other minerals in your bones. The test may be used to diagnose or screen you for a condition that causes weak or thin bones (osteoporosis), predict your risk for a broken bone (fracture), or determine how well your osteoporosis treatment is working. The bone density test is recommended for females 65 and older, or females or males <65 if certain risk factors such as thyroid disease, long term use of steroids such as for asthma or rheumatological issues, vitamin D deficiency, estrogen deficiency, family history of osteoporosis, self or family history of fragility fracture in first  degree relative.  Bone density 2019 normal.  Plan repeat possibly 2024.  Heart health: Get at least 150 minutes of aerobic exercise weekly Limit alcohol It is important to maintain a healthy blood pressure and healthy cholesterol numbers  Heart disease screening: Screening for heart disease includes screening for blood pressure, fasting lipids, glucose/diabetes screening, BMI height to weight ratio, reviewed of smoking status, physical activity, and diet.    Goals include blood pressure 120/80 or less, maintaining a healthy lipid/cholesterol profile, preventing diabetes or keeping diabetes numbers under good control, not smoking or using tobacco products, exercising most days per week or at least 150 minutes per week of exercise, and eating healthy variety of fruits and vegetables, healthy oils, and avoiding unhealthy food choices like fried food, fast food, high sugar and high cholesterol foods.    Other tests may possibly include EKG test, CT coronary calcium score, echocardiogram, exercise treadmill stress test.   I recommend baseline echo and CT coronary.  She declines    Medical care options: I recommend you continue to seek care here first for routine care.  We try really hard to have available appointments Monday through Friday daytime hours for sick visits, acute visits, and physicals.  Urgent care should be used for after hours and weekends for significant issues that cannot wait till the next day.  The emergency department should be used for significant potentially life-threatening emergencies.  The emergency department is expensive, can often have long wait times for less significant concerns, so try to utilize primary care, urgent care, or telemedicine when possible to avoid unnecessary trips to the emergency department.  Virtual visits and telemedicine have been introduced since the pandemic started in 2020, and can be convenient ways to receive medical care.  We offer virtual  appointments as well to  assist you in a variety of options to seek medical care.   Advanced Directives: I recommend you consider completing a Health Care Power of Attorney and Living Will.   These documents respect your wishes and help alleviate burdens on your loved ones if you were to become terminally ill or be in a position to need those documents enforced.    You can complete Advanced Directives yourself, have them notarized, then have copies made for our office, for you and for anybody you feel should have them in safe keeping.  Or, you can have an attorney prepare these documents.   If you haven't updated your Last Will and Testament in a while, it may be worthwhile having an attorney prepare these documents together and save on some costs.      Separate specific issues: Continue current medications  Diabetes-she has prior used Invokana, Januvia, metformin, Kombiglyze Janumet.  She did not tolerate Invokana or Janumet.  She does well Kombiglyze.  Consider checking with insurance about Jentadueto or Mirian CapuchinKazano which are alternatives to Avery DennisonKombiglyze    Milderd was seen today for fasting cpe, awv.  Diagnoses and all orders for this visit:  Essential hypertension -     Comprehensive metabolic panel -     CBC  Medicare annual wellness visit, subsequent  Hyperlipidemia associated with type 2 diabetes mellitus (HCC) -     Lipid panel  Vitamin D deficiency  Vaccine counseling  Colonoscopy refused  Depression, major, in remission (HCC)  Diabetes mellitus with complication (HCC) -     Hemoglobin A1c -     Microalbumin/Creatinine Ratio, Urine  Estrogen deficiency  Generalized anxiety disorder  Need for immunization against influenza -     Flu Vaccine QUAD High Dose(Fluad)  Post-menopausal  Spent > 45 minutes face to face with patient in discussion of symptoms, evaluation, plan and recommendations.     Medicare Attestation A preventative services visit was completed  today.  During the course of the visit the patient was educated and counseled about appropriate screening and preventive services.  A health risk assessment was established with the patient that included a review of current medications, allergies, social history, family history, medical and preventative health history, biometrics, and preventative screenings to identify potential safety concerns or impairments.  A personalized plan was printed today for the patient's records and use.   Personalized health advice and education was given today to reduce health risks and promote self management and wellness.  Information regarding end of life planning was discussed today.  Kristian CoveyShane Tysinger, PA-C   11/17/2021

## 2021-11-18 ENCOUNTER — Other Ambulatory Visit: Payer: Self-pay | Admitting: Medical

## 2021-11-18 DIAGNOSIS — I10 Essential (primary) hypertension: Secondary | ICD-10-CM

## 2021-11-18 LAB — COMPREHENSIVE METABOLIC PANEL
ALT: 10 IU/L (ref 0–32)
AST: 18 IU/L (ref 0–40)
Albumin/Globulin Ratio: 2.1 (ref 1.2–2.2)
Albumin: 4.5 g/dL (ref 3.8–4.8)
Alkaline Phosphatase: 72 IU/L (ref 44–121)
BUN/Creatinine Ratio: 10 — ABNORMAL LOW (ref 12–28)
BUN: 11 mg/dL (ref 8–27)
Bilirubin Total: 0.2 mg/dL (ref 0.0–1.2)
CO2: 22 mmol/L (ref 20–29)
Calcium: 9.6 mg/dL (ref 8.7–10.3)
Chloride: 102 mmol/L (ref 96–106)
Creatinine, Ser: 1.05 mg/dL — ABNORMAL HIGH (ref 0.57–1.00)
Globulin, Total: 2.1 g/dL (ref 1.5–4.5)
Glucose: 116 mg/dL — ABNORMAL HIGH (ref 70–99)
Potassium: 4.3 mmol/L (ref 3.5–5.2)
Sodium: 138 mmol/L (ref 134–144)
Total Protein: 6.6 g/dL (ref 6.0–8.5)
eGFR: 57 mL/min/{1.73_m2} — ABNORMAL LOW (ref >59)

## 2021-11-18 LAB — CBC
Hematocrit: 37.7 % (ref 34.0–46.6)
Hemoglobin: 12.5 g/dL (ref 11.1–15.9)
MCH: 30 pg (ref 26.6–33.0)
MCHC: 33.2 g/dL (ref 31.5–35.7)
MCV: 91 fL (ref 79–97)
Platelets: 271 10*3/uL (ref 150–450)
RBC: 4.16 x10E6/uL (ref 3.77–5.28)
RDW: 13.6 % (ref 11.7–15.4)
WBC: 7.8 10*3/uL (ref 3.4–10.8)

## 2021-11-18 LAB — MICROALBUMIN / CREATININE URINE RATIO
Creatinine, Urine: 108.6 mg/dL
Microalb/Creat Ratio: 3 mg/g creat (ref 0–29)
Microalbumin, Urine: 3.2 ug/mL

## 2021-11-18 LAB — LIPID PANEL
Chol/HDL Ratio: 3.2 ratio (ref 0.0–4.4)
Cholesterol, Total: 149 mg/dL (ref 100–199)
HDL: 47 mg/dL (ref >39)
LDL Chol Calc (NIH): 82 mg/dL (ref 0–99)
Triglycerides: 107 mg/dL (ref 0–149)
VLDL Cholesterol Cal: 20 mg/dL (ref 5–40)

## 2021-11-18 LAB — HEMOGLOBIN A1C
Est. average glucose Bld gHb Est-mCnc: 143 mg/dL
Hgb A1c MFr Bld: 6.6 % — ABNORMAL HIGH (ref 4.8–5.6)

## 2021-11-18 MED ORDER — BUPROPION HCL ER (XL) 150 MG PO TB24
150.0000 mg | ORAL_TABLET | Freq: Every day | ORAL | 3 refills | Status: DC
Start: 2021-11-18 — End: 2022-02-08

## 2021-11-18 MED ORDER — ATENOLOL 50 MG PO TABS: 50.0000 mg | ORAL_TABLET | Freq: Every day | ORAL | 3 refills | Status: DC

## 2021-11-18 MED ORDER — SAXAGLIPTIN-METFORMIN ER 5-500 MG PO TB24: 1.0000 | ORAL_TABLET | Freq: Every day | ORAL | 3 refills | Status: DC

## 2021-11-18 MED ORDER — ROSUVASTATIN CALCIUM 20 MG PO TABS: 20.0000 mg | ORAL_TABLET | Freq: Every day | ORAL | 3 refills | Status: DC

## 2021-11-18 MED ORDER — SERTRALINE HCL 100 MG PO TABS: ORAL_TABLET | ORAL | 3 refills | Status: DC

## 2021-11-22 ENCOUNTER — Encounter: Payer: Self-pay | Admitting: Internal Medicine

## 2021-12-03 ENCOUNTER — Telehealth: Payer: Self-pay | Admitting: Medical

## 2021-12-03 NOTE — Telephone Encounter (Signed)
P.A. KOMBIGLYZE  °

## 2021-12-23 NOTE — Telephone Encounter (Signed)
Sent in medical records for P.A.

## 2021-12-26 ENCOUNTER — Other Ambulatory Visit: Payer: Self-pay | Admitting: Medical

## 2021-12-26 DIAGNOSIS — Z1231 Encounter for screening mammogram for malignant neoplasm of breast: Secondary | ICD-10-CM

## 2021-12-27 NOTE — Telephone Encounter (Signed)
P.A. denied, pt needs trial & failure of generic Kombiglyze XR, Saxagliptin-metformin ER.  Called pt and she is ok with switching.  Please send in generic

## 2021-12-28 ENCOUNTER — Other Ambulatory Visit: Payer: Self-pay | Admitting: Medical

## 2021-12-28 MED ORDER — SAXAGLIPTIN-METFORMIN ER 5-500 MG PO TB24: 1.0000 | ORAL_TABLET | Freq: Every day | ORAL | 1 refills | Status: DC

## 2021-12-28 NOTE — Telephone Encounter (Signed)
Yes I was able to get the P.A.approved last time but now there is a generic available.  Insurance is now requiring a trial & failure of the generic & patient is ok with trying this.

## 2022-01-23 DIAGNOSIS — H52223 Regular astigmatism, bilateral: Secondary | ICD-10-CM | POA: Diagnosis not present

## 2022-01-23 DIAGNOSIS — H524 Presbyopia: Secondary | ICD-10-CM | POA: Diagnosis not present

## 2022-01-23 DIAGNOSIS — H2513 Age-related nuclear cataract, bilateral: Secondary | ICD-10-CM | POA: Diagnosis not present

## 2022-01-23 DIAGNOSIS — H5203 Hypermetropia, bilateral: Secondary | ICD-10-CM | POA: Diagnosis not present

## 2022-01-23 DIAGNOSIS — E119 Type 2 diabetes mellitus without complications: Secondary | ICD-10-CM | POA: Diagnosis not present

## 2022-01-23 LAB — HM DIABETES EYE EXAM

## 2022-02-08 ENCOUNTER — Ambulatory Visit: Payer: Medicare Other

## 2022-02-08 ENCOUNTER — Other Ambulatory Visit: Payer: Self-pay | Admitting: Family Medicine

## 2022-02-14 ENCOUNTER — Ambulatory Visit
Admission: RE | Admit: 2022-02-14 | Discharge: 2022-02-14 | Disposition: A | Discharge status: Home / Self Care | Payer: Medicare Other | Source: Ambulatory Visit | Attending: Medical | Admitting: Medical

## 2022-02-14 DIAGNOSIS — Z1231 Encounter for screening mammogram for malignant neoplasm of breast: Secondary | ICD-10-CM | POA: Diagnosis not present

## 2022-02-15 NOTE — Progress Notes (Signed)
Results sent through MyChart

## 2022-03-17 ENCOUNTER — Telehealth: Payer: Self-pay | Admitting: Medical

## 2022-03-17 NOTE — Telephone Encounter (Signed)
Pt states she got a letter from her insurance company that her meds are going to higher tiers & they are increasing in price this year and suggested lower cost alternatives and she would like to switch if that is ok with you.  Valsartan/HCTZ (Diovan) switch to separate pills Valsartan 320mg  & HCTZ 25mg  Diltiazem switch to Verapamil tab ER  Kombiglyze switch to Jentadueto XR  She needs these today if possible

## 2022-03-23 ENCOUNTER — Other Ambulatory Visit: Payer: Self-pay | Admitting: Medical

## 2022-03-23 MED ORDER — HYDROCHLOROTHIAZIDE 25 MG PO TABS: 25.0000 mg | ORAL_TABLET | Freq: Every day | ORAL | 0 refills | Status: DC

## 2022-03-23 MED ORDER — JENTADUETO 2.5-500 MG PO TABS: 1.0000 | ORAL_TABLET | Freq: Two times a day (BID) | ORAL | 0 refills | Status: DC

## 2022-03-23 MED ORDER — VERAPAMIL HCL ER 180 MG PO TBCR: 180.0000 mg | EXTENDED_RELEASE_TABLET | Freq: Every day | ORAL | 0 refills | Status: DC

## 2022-03-23 MED ORDER — VALSARTAN 320 MG PO TABS: 320.0000 mg | ORAL_TABLET | Freq: Every day | ORAL | 0 refills | Status: DC

## 2022-03-27 ENCOUNTER — Telehealth: Payer: Self-pay | Admitting: Emergency Medicine

## 2022-03-27 NOTE — Telephone Encounter (Signed)
Copied from [place]. Topic: General - Other >> Mar 27, 2022 12:33 PM Sabas Sous wrote: Reason for CRM: Drucie Opitz form [place] services called to report that a recent letter they received from Brenda [name] is missing a signature   Best contact: [phone]  It's the work note about her chronic conditions that take her out of work.   Fax: [phone]

## 2022-04-05 NOTE — Telephone Encounter (Signed)
Left message for pt and need to call back and schedule follow up

## 2022-05-19 ENCOUNTER — Encounter: Payer: Self-pay | Admitting: Medical

## 2022-05-19 ENCOUNTER — Ambulatory Visit (INDEPENDENT_AMBULATORY_CARE_PROVIDER_SITE_OTHER): Payer: Medicare Other | Admitting: Medical

## 2022-05-19 VITALS — BP 130/70 | HR 54 | Ht 66.0 in | Wt 177.6 lb

## 2022-05-19 DIAGNOSIS — E559 Vitamin D deficiency, unspecified: Secondary | ICD-10-CM

## 2022-05-19 DIAGNOSIS — I10 Essential (primary) hypertension: Secondary | ICD-10-CM

## 2022-05-19 DIAGNOSIS — E1169 Type 2 diabetes mellitus with other specified complication: Secondary | ICD-10-CM

## 2022-05-19 DIAGNOSIS — E785 Hyperlipidemia, unspecified: Secondary | ICD-10-CM | POA: Diagnosis not present

## 2022-05-19 DIAGNOSIS — E118 Type 2 diabetes mellitus with unspecified complications: Secondary | ICD-10-CM

## 2022-05-19 NOTE — Patient Instructions (Addendum)
Compare you drug prices with [facility], or other mail order such as [facility], or   [facility]  Mail order pharmacy  [email or web address]  Mail order pharmacy    Regarding Jentadueto, check pricing and coverage for alternatives: Kazano (alogliptin/metformin)  Janumet  Kombiglyze  [employer]

## 2022-05-19 NOTE — Progress Notes (Signed)
Subjective:    Brenda [name] is a 72 y.o. female who presents for Preventative Services visit and chronic medical problems/med check visit.    Chief Complaint  Patient presents with   Hypertension    Med check for hypertension. No new concerns.    Here for med check.  She has been in her usual state of health without complaint.  Recently insurance notified her that they would not cover several of her medicines that she had been using for a while now and stable on those medications.  This was very frustrating to her given that she was having no difficulty or problems with the medications and they were actually working to control her blood sugar and blood pressure.  Nevertheless she did change to the new medications since we had to switch due to insurance requirements  She did call and complain with the insurance about this issue that was not medically necessary and purely financial on the part of the insurance company  Home blood pressures have been fine.  She is not checking her sugars all that often.  She has no current symptoms of concern  Past Medical History:  Diagnosis Date   Depression    Diabetes mellitus    Former smoker    Hyperlipidemia    Hypertension    Osteopenia    2016 bone density, normal appearing bone density 2019   Routine gynecological examination    Dr. Gaynell FaceMarshall   Vitamin D deficiency    Wears dentures    Wears glasses     Allergies  Allergen Reactions   Janumet Xr [Sitagliptin-Metformin Hcl Er]     Bad headaches   Metformin And Related     Nausea, anorexia   Nuprin [Ibuprofen] Hives    Can tolerate generic Ibuprofen   Current Outpatient Medications on File Prior to Visit  Medication Sig Dispense Refill   atenolol (TENORMIN) 50 MG tablet Take 1 tablet (50 mg total) by mouth daily. 90 tablet 3   buPROPion (WELLBUTRIN XL) 150 MG 24 hr tablet TAKE 1 TABLET(150 MG) BY MOUTH DAILY 90 tablet 0   co-enzyme Q-10 50 MG capsule Take 1 capsule (50 mg  total) by mouth daily. 90 capsule 3   fish oil-omega-3 fatty acids 1000 MG capsule Take 1 g by mouth daily.       hydrochlorothiazide (HYDRODIURIL) 25 MG tablet TAKE 1 TABLET(25 MG) BY MOUTH DAILY 90 tablet 0   linaGLIPtin-metFORMIN HCl (JENTADUETO) 2.5-500 MG TABS Take 1 tablet by mouth in the morning and at bedtime. 60 tablet 0   rosuvastatin (CRESTOR) 20 MG tablet TAKE 1 TABLET(20 MG) BY MOUTH DAILY 90 tablet 0   sertraline (ZOLOFT) 100 MG tablet TAKE HALF TABLET BY MOUTH EVERY DAY 90 tablet 3   valsartan (DIOVAN) 320 MG tablet TAKE 1 TABLET(320 MG) BY MOUTH DAILY 90 tablet 0   verapamil (CALAN-SR) 180 MG CR tablet TAKE 1 TABLET(180 MG) BY MOUTH DAILY 90 tablet 0   vitamin E 200 UNIT capsule Take 200 Units by mouth daily.     No current facility-administered medications on file prior to visit.    History reviewed: allergies, current medications, past family history, past medical history, past social history, past surgical history and problem list   ROS as in subjective   Objective:    BP 130/70   Pulse (!) 54   Ht 5\' 6"  (1.676 m)   Wt 177 lb 9.6 oz (80.6 kg)   LMP  (LMP Unknown)  SpO2 99%   BMI 28.67 kg/m   Wt Readings from Last 3 Encounters:  05/19/22 177 lb 9.6 oz (80.6 kg)  11/17/21 178 lb 3.2 oz (80.8 kg)  06/10/21 182 lb 9.6 oz (82.8 kg)   BP Readings from Last 3 Encounters:  05/19/22 130/70  11/17/21 120/70  06/10/21 122/80   Gen: wd, wn, nad Neck: supple, no lymphadenopathy, no thyromegaly, no masse, no bruits Heart: RRR, normal S1, S2, no murmurs Lungs: CTA bilaterally, no wheezes, rhonchi, or rales Extremities: no edema, no cyanosis, no clubbing Pulses: 2+ symmetric, upper and lower extremities, normal cap refill    Assessment:   Encounter Diagnoses  Name Primary?   Essential hypertension Yes   Diabetes mellitus with complication    Vitamin D deficiency    Hyperlipidemia associated with type 2 diabetes mellitus      Plan:    Hypertension Controlled on current therapy Unfortunately had to switch to different medicines recently added requirement of the insurance no longer covering the medicine she was on before.  She had been stable on that regimen prior Continue atenolol 50 mg daily, hydrochlorothiazide 25 mg daily, valsartan 320 mg daily, verapamil SR 180 mg daily  Diabetes Was stable on Kombiglyze.  Now on Jentadueto.  So far doing okay on this medication however it is expensive She will check with pharmacy and insurance about other options that may be cheaper Advised glucose monitoring  Vitamin D deficiency-compliant with supplement  Hyperlipidemia-compliant with Crestor 20 mg daily   Shanoah was seen today for hypertension.  Diagnoses and all orders for this visit:  Essential hypertension  Diabetes mellitus with complication  Vitamin D deficiency  Hyperlipidemia associated with type 2 diabetes mellitus    F/u 4 to 6 months

## 2022-06-02 ENCOUNTER — Other Ambulatory Visit: Payer: Self-pay | Admitting: Medical

## 2022-06-03 ENCOUNTER — Other Ambulatory Visit: Payer: Self-pay | Admitting: Medical
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
## 2022-09-06 ENCOUNTER — Other Ambulatory Visit: Payer: Self-pay | Admitting: Medical

## 2022-09-08 ENCOUNTER — Other Ambulatory Visit: Payer: Self-pay

## 2022-09-08 MED ORDER — VERAPAMIL HCL ER 180 MG PO TBCR: 180.0000 mg | EXTENDED_RELEASE_TABLET | Freq: Every day | ORAL | 0 refills | Status: DC

## 2022-09-08 MED ORDER — HYDROCHLOROTHIAZIDE 25 MG PO TABS: 25.0000 mg | ORAL_TABLET | Freq: Every day | ORAL | 0 refills | Status: DC

## 2022-11-16 ENCOUNTER — Other Ambulatory Visit: Payer: Self-pay | Admitting: Medical

## 2022-11-16 DIAGNOSIS — Z1231 Encounter for screening mammogram for malignant neoplasm of breast: Secondary | ICD-10-CM

## 2022-11-28 ENCOUNTER — Ambulatory Visit: Payer: Medicare Other | Admitting: Medical

## 2022-11-28 ENCOUNTER — Encounter: Payer: Self-pay | Admitting: Internal Medicine

## 2022-11-28 ENCOUNTER — Encounter: Payer: Self-pay | Admitting: Medical

## 2022-11-28 VITALS — BP 124/80 | HR 62 | Ht 67.0 in | Wt 176.8 lb

## 2022-11-28 DIAGNOSIS — E1169 Type 2 diabetes mellitus with other specified complication: Secondary | ICD-10-CM | POA: Diagnosis not present

## 2022-11-28 DIAGNOSIS — E559 Vitamin D deficiency, unspecified: Secondary | ICD-10-CM | POA: Diagnosis not present

## 2022-11-28 DIAGNOSIS — Z780 Asymptomatic menopausal state: Secondary | ICD-10-CM | POA: Diagnosis not present

## 2022-11-28 DIAGNOSIS — R519 Headache, unspecified: Secondary | ICD-10-CM

## 2022-11-28 DIAGNOSIS — E785 Hyperlipidemia, unspecified: Secondary | ICD-10-CM | POA: Diagnosis not present

## 2022-11-28 DIAGNOSIS — E118 Type 2 diabetes mellitus with unspecified complications: Secondary | ICD-10-CM

## 2022-11-28 DIAGNOSIS — I10 Essential (primary) hypertension: Secondary | ICD-10-CM | POA: Diagnosis not present

## 2022-11-28 DIAGNOSIS — Z23 Encounter for immunization: Secondary | ICD-10-CM | POA: Diagnosis not present

## 2022-11-28 NOTE — Patient Instructions (Signed)
Please call to schedule your bone density   [facility]  [phone] [address], [address] [place], [place] [address]

## 2022-11-28 NOTE — Progress Notes (Signed)
Subjective:    Brenda [name] is a 72 y.o. female who present for med check  Chief Complaint  Patient presents with   Medical Management of Chronic Issues    6 month follow-up. Flu shot today, declines covid. Requested eye exam   Here for med check.  She has been in her usual state of health without complaint.  Hypertension-last visit we had the change from diltiazem due to insurance.  She is currently on verapamil and doing okay with this but has more headaches of lately.  She is also compliant with her other blood pressure medications.  Hyperlipidemia-compliant with statin without complaint  Diabetes-no recent symptoms of concern.  Compliant with Jentadueto  Curious about doing updated bone density test.  She is taking calcium 600 mg daily, vitamin D 400 mg daily.  She would like a flu shot today  Lately is having symptoms related to headaches and some issues with not being able to get sleep.  She does have some sinus congestion at times not sure that is aggravating headaches.  She gets in the bed and lies there with thinking about all sorts of things and has trouble getting to sleep.  Not particularly anxious otherwise today.  No depressed mood.    Past Medical History:  Diagnosis Date   Depression    Diabetes mellitus    Former smoker    Hyperlipidemia    Hypertension    Osteopenia    2016 bone density, normal appearing bone density 2019   Routine gynecological examination    Dr. Gaynell Face   Vitamin D deficiency    Wears dentures    Wears glasses     Allergies  Allergen Reactions   Janumet Xr [Sitagliptin-Metformin Hcl Er]     Bad headaches   Metformin And Related     Nausea, anorexia   Nuprin [Ibuprofen] Hives    Can tolerate generic Ibuprofen   Current Outpatient Medications on File Prior to Visit  Medication Sig Dispense Refill   atenolol (TENORMIN) 50 MG tablet Take 1 tablet (50 mg total) by mouth daily. 90 tablet 3   buPROPion (WELLBUTRIN XL) 150  MG 24 hr tablet TAKE 1 TABLET(150 MG) BY MOUTH DAILY 90 tablet 1   co-enzyme Q-10 50 MG capsule Take 1 capsule (50 mg total) by mouth daily. 90 capsule 3   fish oil-omega-3 fatty acids 1000 MG capsule Take 1 g by mouth daily.       hydrochlorothiazide (HYDRODIURIL) 25 MG tablet Take 1 tablet (25 mg total) by mouth daily. 90 tablet 0   linaGLIPtin-metFORMIN HCl (JENTADUETO) 2.5-500 MG TABS Take 1 tablet by mouth in the morning and at bedtime. 60 tablet 0   rosuvastatin (CRESTOR) 20 MG tablet TAKE 1 TABLET(20 MG) BY MOUTH DAILY 90 tablet 0   sertraline (ZOLOFT) 100 MG tablet TAKE HALF TABLET BY MOUTH EVERY DAY 90 tablet 3   valsartan (DIOVAN) 320 MG tablet TAKE 1 TABLET(320 MG) BY MOUTH DAILY 90 tablet 1   verapamil (CALAN-SR) 180 MG CR tablet Take 1 tablet (180 mg total) by mouth daily. 90 tablet 0   vitamin E 200 UNIT capsule Take 200 Units by mouth daily.     No current facility-administered medications on file prior to visit.    History reviewed: allergies, current medications, past family history, past medical history, past social history, past surgical history and problem list   ROS as in subjective   Objective:    BP 124/80   Pulse 62  Ht 5\' 7"  (1.702 m)   Wt 176 lb 12.8 oz (80.2 kg)   LMP  (LMP Unknown)   BMI 27.69 kg/m   Wt Readings from Last 3 Encounters:  11/28/22 176 lb 12.8 oz (80.2 kg)  05/19/22 177 lb 9.6 oz (80.6 kg)  11/17/21 178 lb 3.2 oz (80.8 kg)   BP Readings from Last 3 Encounters:  11/28/22 124/80  05/19/22 130/70  11/17/21 120/70   Gen: wd, wn, nad Neck: supple, no lymphadenopathy, no thyromegaly, no masse, no bruits Heart: RRR, normal S1, S2, no murmurs Lungs: CTA bilaterally, no wheezes, rhonchi, or rales Extremities: no edema, no cyanosis, no clubbing Pulses: 2+ symmetric, upper and lower extremities, normal cap refill  Diabetic Foot Exam - Simple   Simple Foot Form Diabetic Foot exam was performed with the following findings: Yes 11/28/2022  11:38 AM  Visual Inspection No deformities, no ulcerations, no other skin breakdown bilaterally: Yes Sensation Testing Intact to touch and monofilament testing bilaterally: Yes Pulse Check Posterior Tibialis and Dorsalis pulse intact bilaterally: Yes Comments       Assessment:   Encounter Diagnoses  Name Primary?   Essential hypertension Yes   Needs flu shot    Postmenopausal estrogen deficiency    Hyperlipidemia associated with type 2 diabetes mellitus (HCC)    Vitamin D deficiency    Diabetes mellitus with complication (HCC)    Nonintractable headache, unspecified chronicity pattern, unspecified headache type      Plan:    Hypertension Controlled on current therapy Last visit due to insurance requirements we had to switch off of diltiazem which she was on long-term.   Blood pressure looks pretty good today. Continue atenolol 50 mg daily, hydrochlorothiazide 25 mg daily, Diovan 320 mg daily and verapamil 180 mg CR daily  hyperlipidemia-continue Crestor 20 mg daily.  Routine labs today  Counseled on the influenza virus vaccine.  Vaccine information sheet given.  Influenza vaccine given after consent obtained.  Diabetes-updated labs today.  Continue Jentadueto 2.5/5 mg twice daily.  Vitamin D deficiency-she is compliant with low-dose calcium and vitamin D over-the-counter.  Updated labs today.  Postmenopausal estrogen deficiency-Go ahead and schedule updated bone density test.  Last bone density test normal in 2019.  Headaches-could be multifactorial.  Sleep disturbance.  We discussed stress reduction, journaling at night.  She does have a little bit of congestion so discussed possibly using Flonase during the fall allergy season.  If not improving despite some these modifications discussed, then recheck  [name] was seen today for medical management of chronic issues.  Diagnoses and all orders for this visit:  Essential hypertension -     Comprehensive metabolic  panel -     CBC  Needs flu shot -     Flu Vaccine Trivalent High Dose (Fluad)  Postmenopausal estrogen deficiency -     DG Bone Density; Future  Hyperlipidemia associated with type 2 diabetes mellitus (HCC) -     Lipid panel  Vitamin D deficiency -     VITAMIN D 25 Hydroxy (Vit-D Deficiency, Fractures)  Diabetes mellitus with complication (HCC) -     Hemoglobin A1c -     Microalbumin/Creatinine Ratio, Urine  Nonintractable headache, unspecified chronicity pattern, unspecified headache type    F/u 6 months

## 2022-11-29 ENCOUNTER — Other Ambulatory Visit: Payer: Self-pay | Admitting: Medical

## 2022-11-29 DIAGNOSIS — I10 Essential (primary) hypertension: Secondary | ICD-10-CM

## 2022-11-29 LAB — LIPID PANEL
Cholesterol, Total: 161 mg/dL (ref 100–199)
HDL: 53 mg/dL (ref >39)
LDL CALC COMMENT:: 3 ratio (ref 0.0–4.4)
LDL Chol Calc (NIH): 94 mg/dL (ref 0–99)
Triglycerides: 70 mg/dL (ref 0–149)
VLDL Cholesterol Cal: 14 mg/dL (ref 5–40)

## 2022-11-29 LAB — COMPREHENSIVE METABOLIC PANEL
ALT: 14 IU/L (ref 0–32)
AST: 19 IU/L (ref 0–40)
Albumin: 4.2 g/dL (ref 3.8–4.8)
Alkaline Phosphatase: 79 IU/L (ref 44–121)
BUN/Creatinine Ratio: 12 (ref 12–28)
BUN: 14 mg/dL (ref 8–27)
Bilirubin Total: 0.2 mg/dL (ref 0.0–1.2)
CO2: 24 mmol/L (ref 20–29)
Calcium: 9.6 mg/dL (ref 8.7–10.3)
Chloride: 104 mmol/L (ref 96–106)
Creatinine, Ser: 1.17 mg/dL — ABNORMAL HIGH (ref 0.57–1.00)
Globulin, Total: 2.6 g/dL (ref 1.5–4.5)
Glucose: 114 mg/dL — ABNORMAL HIGH (ref 70–99)
Potassium: 4.7 mmol/L (ref 3.5–5.2)
Sodium: 140 mmol/L (ref 134–144)
Total Protein: 6.8 g/dL (ref 6.0–8.5)
eGFR: 50 mL/min/{1.73_m2} — ABNORMAL LOW (ref >59)

## 2022-11-29 LAB — CBC
Hematocrit: 37.9 % (ref 34.0–46.6)
Hemoglobin: 12.4 g/dL (ref 11.1–15.9)
MCH: 29.2 pg (ref 26.6–33.0)
MCHC: 32.7 g/dL (ref 31.5–35.7)
MCV: 89 fL (ref 79–97)
Platelets: 254 10*3/uL (ref 150–450)
RBC: 4.25 x10E6/uL (ref 3.77–5.28)
RDW: 14.2 % (ref 11.7–15.4)
WBC: 6.9 10*3/uL (ref 3.4–10.8)

## 2022-11-29 LAB — HEMOGLOBIN A1C
Est. average glucose Bld gHb Est-mCnc: 146 mg/dL
Hgb A1c MFr Bld: 6.7 % — ABNORMAL HIGH (ref 4.8–5.6)

## 2022-11-29 LAB — MICROALBUMIN / CREATININE URINE RATIO
Creatinine, Urine: 94.6 mg/dL
Microalb/Creat Ratio: 7 mg/g{creat} (ref 0–29)
Microalbumin, Urine: 6.2 ug/mL

## 2022-11-29 LAB — VITAMIN D 25 HYDROXY (VIT D DEFICIENCY, FRACTURES): Vit D, 25-Hydroxy: 43 ng/mL (ref 30.0–100.0)

## 2022-11-29 MED ORDER — ROSUVASTATIN CALCIUM 20 MG PO TABS: 20.0000 mg | ORAL_TABLET | Freq: Every day | ORAL | 3 refills | Status: DC

## 2022-11-29 MED ORDER — JENTADUETO 2.5-500 MG PO TABS: 1.0000 | ORAL_TABLET | Freq: Two times a day (BID) | ORAL | 1 refills | Status: DC

## 2022-11-29 MED ORDER — VALSARTAN 320 MG PO TABS: 320.0000 mg | ORAL_TABLET | Freq: Every day | ORAL | 3 refills | Status: DC

## 2022-11-29 MED ORDER — SERTRALINE HCL 100 MG PO TABS: ORAL_TABLET | ORAL | 3 refills | Status: DC

## 2022-11-29 MED ORDER — VERAPAMIL HCL ER 180 MG PO TBCR: 180.0000 mg | EXTENDED_RELEASE_TABLET | Freq: Every day | ORAL | 3 refills | Status: DC

## 2022-11-29 MED ORDER — BUPROPION HCL ER (XL) 150 MG PO TB24: 150.0000 mg | ORAL_TABLET | Freq: Every day | ORAL | 3 refills | Status: DC

## 2022-11-29 MED ORDER — HYDROCHLOROTHIAZIDE 25 MG PO TABS: 25.0000 mg | ORAL_TABLET | Freq: Every day | ORAL | 3 refills | Status: DC

## 2022-11-29 MED ORDER — ATENOLOL 50 MG PO TABS
50.0000 mg | ORAL_TABLET | Freq: Every day | ORAL | 3 refills | Status: DC
Start: 2022-11-29 — End: 2023-12-07

## 2022-11-29 NOTE — Progress Notes (Signed)
Results sent through MyChart

## 2022-12-01 ENCOUNTER — Other Ambulatory Visit: Payer: Self-pay | Admitting: Medical

## 2022-12-01 DIAGNOSIS — Z780 Asymptomatic menopausal state: Secondary | ICD-10-CM

## 2022-12-08 ENCOUNTER — Encounter: Payer: Self-pay | Admitting: Medical

## 2023-02-03 ENCOUNTER — Other Ambulatory Visit: Payer: Self-pay | Admitting: Medical

## 2023-02-05 DIAGNOSIS — E119 Type 2 diabetes mellitus without complications: Secondary | ICD-10-CM | POA: Diagnosis not present

## 2023-02-05 DIAGNOSIS — H2513 Age-related nuclear cataract, bilateral: Secondary | ICD-10-CM | POA: Diagnosis not present

## 2023-02-05 DIAGNOSIS — H5203 Hypermetropia, bilateral: Secondary | ICD-10-CM | POA: Diagnosis not present

## 2023-02-05 DIAGNOSIS — H524 Presbyopia: Secondary | ICD-10-CM | POA: Diagnosis not present

## 2023-02-05 DIAGNOSIS — H52223 Regular astigmatism, bilateral: Secondary | ICD-10-CM | POA: Diagnosis not present

## 2023-02-20 ENCOUNTER — Ambulatory Visit
Admission: RE | Admit: 2023-02-20 | Discharge: 2023-02-20 | Disposition: A | Discharge status: Home / Self Care | Payer: Medicare Other | Source: Ambulatory Visit | Attending: Medical | Admitting: Medical

## 2023-02-20 DIAGNOSIS — Z1231 Encounter for screening mammogram for malignant neoplasm of breast: Secondary | ICD-10-CM

## 2023-05-30 ENCOUNTER — Encounter: Payer: Medicare Other | Admitting: Medical

## 2023-06-06 ENCOUNTER — Ambulatory Visit: Payer: Medicare Other | Admitting: Medical

## 2023-06-06 VITALS — BP 132/84 | HR 81 | Ht 66.5 in | Wt 174.6 lb

## 2023-06-06 DIAGNOSIS — E785 Hyperlipidemia, unspecified: Secondary | ICD-10-CM | POA: Diagnosis not present

## 2023-06-06 DIAGNOSIS — E118 Type 2 diabetes mellitus with unspecified complications: Secondary | ICD-10-CM

## 2023-06-06 DIAGNOSIS — N1831 Chronic kidney disease, stage 3a: Secondary | ICD-10-CM | POA: Diagnosis not present

## 2023-06-06 DIAGNOSIS — I10 Essential (primary) hypertension: Secondary | ICD-10-CM

## 2023-06-06 DIAGNOSIS — E1169 Type 2 diabetes mellitus with other specified complication: Secondary | ICD-10-CM | POA: Diagnosis not present

## 2023-06-06 MED ORDER — ROSUVASTATIN CALCIUM 20 MG PO TABS: 20.0000 mg | ORAL_TABLET | Freq: Every day | ORAL | 1 refills | Status: DC

## 2023-06-06 MED ORDER — JENTADUETO XR 5-1000 MG PO TB24: 1.0000 | ORAL_TABLET | Freq: Every day | ORAL | 2 refills | Status: DC

## 2023-06-06 MED ORDER — VALSARTAN 320 MG PO TABS: 320.0000 mg | ORAL_TABLET | Freq: Every day | ORAL | 1 refills | Status: DC

## 2023-06-06 NOTE — Progress Notes (Signed)
 Subjective:  Brenda [name] is a 73 y.o. female who presents for Chief Complaint  Patient presents with   Annual Exam    Fasting cpe, no concerns. Myeye dr friendly December 2024     Here for med check.   Doing fine, compliant with medication  Sometimes feels a little lightheaded with verapamil  but tolerates this.   Checks BP and glucose at home and numbers are usually normal.  Occasionally gets elevated BP reading.   Drinks ok amount of water.  Avoid NSAIDs.   No other aggravating or relieving factors.    No other c/o.  Past Medical History:  Diagnosis Date   Depression    Diabetes mellitus    Former smoker    Hyperlipidemia    Hypertension    Osteopenia    2016 bone density, normal appearing bone density 2019   Routine gynecological examination    Dr. Charon Copper   Vitamin D  deficiency    Wears dentures    Wears glasses    Current Outpatient Medications on File Prior to Visit  Medication Sig Dispense Refill   atenolol  (TENORMIN ) 50 MG tablet Take 1 tablet (50 mg total) by mouth daily. 90 tablet 3   buPROPion  (WELLBUTRIN  XL) 150 MG 24 hr tablet Take 1 tablet (150 mg total) by mouth daily. 90 tablet 3   co-enzyme Q-10 50 MG capsule Take 1 capsule (50 mg total) by mouth daily. 90 capsule 3   fish oil-omega-3 fatty acids 1000 MG capsule Take 1 g by mouth daily.       hydrochlorothiazide  (HYDRODIURIL ) 25 MG tablet Take 1 tablet (25 mg total) by mouth daily. 90 tablet 3   linaGLIPtin-metFORMIN  HCl (JENTADUETO ) 2.5-500 MG TABS Take 1 tablet by mouth in the morning and at bedtime. 180 tablet 1   rosuvastatin  (CRESTOR ) 20 MG tablet TAKE 1 TABLET(20 MG) BY MOUTH DAILY 90 tablet 1   sertraline  (ZOLOFT ) 100 MG tablet TAKE HALF TABLET BY MOUTH EVERY DAY 90 tablet 3   valsartan  (DIOVAN ) 320 MG tablet TAKE 1 TABLET(320 MG) BY MOUTH DAILY 90 tablet 1   verapamil  (CALAN -SR) 180 MG CR tablet Take 1 tablet (180 mg total) by mouth daily. 90 tablet 3   vitamin E 200 UNIT capsule Take  200 Units by mouth daily.     No current facility-administered medications on file prior to visit.    The following portions of the patient's history were reviewed and updated as appropriate: allergies, current medications, past family history, past medical history, past social history, past surgical history and problem list.  ROS Otherwise as in subjective above     Objective: BP 132/84   Pulse 81   Ht 5' 6.5" (1.689 m)   Wt 174 lb 9.6 oz (79.2 kg)   LMP  (LMP Unknown)   BMI 27.76 kg/m   Wt Readings from Last 3 Encounters:  06/06/23 174 lb 9.6 oz (79.2 kg)  11/28/22 176 lb 12.8 oz (80.2 kg)  05/19/22 177 lb 9.6 oz (80.6 kg)   BP Readings from Last 3 Encounters:  06/06/23 132/84  11/28/22 124/80  05/19/22 130/70   General appearance: alert, no distress, well developed, well nourished Heart: RRR, normal S1, S2, no murmurs Lungs: CTA bilaterally, no wheezes, rhonchi, or rales Pulses: 2+ radial pulses, 2+ pedal pulses, normal cap refill Ext: no edema     Assessment: Encounter Diagnoses  Name Primary?   Essential hypertension Yes   Hyperlipidemia associated with type 2 diabetes mellitus (HCC)  Diabetes mellitus with complication (HCC)    CKD stage 3a, GFR 45-59 ml/min (HCC)      Plan: Hypertension - continue current regimen.  We discussed the goal to keep BP <130/80, hydrate well throughout the day, avoid NSAIDs and dehydration Continue Atenolol  50mg  daily, Valsartan  320mg  daily, verapamil  SR 180mg  daily, hydrochlorothiazide  25mg  daily   Hyperlipidemia -  continue Crestor  20mg  daily  Diabetes  Labs today Change to XR once daily Jentadueto  5/1000mg  once daily at her request Monitor sugars, check feet daily   CKD3a - discussed lab findings, need for good BP and glucose control, continue current medications, good water intake  We will request copy of her recent eye exam and [facility] documentation    Brenda [name] was seen today for annual exam.  Diagnoses  and all orders for this visit:  Essential hypertension  Hyperlipidemia associated with type 2 diabetes mellitus (HCC)  Diabetes mellitus with complication (HCC)  CKD stage 3a, GFR 45-59 ml/min (HCC)    Follow up: 76mo

## 2023-06-07 LAB — BASIC METABOLIC PANEL WITH GFR
BUN/Creatinine Ratio: 14 (ref 12–28)
BUN: 15 mg/dL (ref 8–27)
CO2: 23 mmol/L (ref 20–29)
Calcium: 9.7 mg/dL (ref 8.7–10.3)
Chloride: 103 mmol/L (ref 96–106)
Creatinine, Ser: 1.04 mg/dL — ABNORMAL HIGH (ref 0.57–1.00)
Glucose: 78 mg/dL (ref 70–99)
Potassium: 4.3 mmol/L (ref 3.5–5.2)
Sodium: 138 mmol/L (ref 134–144)
eGFR: 57 mL/min/{1.73_m2} — ABNORMAL LOW (ref >59)

## 2023-06-07 LAB — HEMOGLOBIN A1C
Est. average glucose Bld gHb Est-mCnc: 146 mg/dL
Hgb A1c MFr Bld: 6.7 % — ABNORMAL HIGH (ref 4.8–5.6)

## 2023-06-07 NOTE — Progress Notes (Signed)
 Results sent through MyChart

## 2023-06-13 ENCOUNTER — Telehealth: Payer: Self-pay | Admitting: Medical

## 2023-06-13 NOTE — Telephone Encounter (Signed)
 Copied from CRM [id number]. Topic: Clinical - Prescription Issue >> Jun 13, 2023  9:43 AM Everlene Hobby D wrote: Patient says she needs to be switched back to Metformin  because she can't afford the $700 for the linaGLIPtin-metFORMIN  HCl ER (JENTADUETO  XR) 06-998 MG TB24

## 2023-06-13 NOTE — Telephone Encounter (Signed)
 Sent mychart message for pt to call back with information after she talks to [employer]

## 2023-06-14 ENCOUNTER — Other Ambulatory Visit: Payer: Self-pay | Admitting: Medical

## 2023-06-14 MED ORDER — METFORMIN HCL ER 500 MG PO TB24: 500.0000 mg | ORAL_TABLET | Freq: Two times a day (BID) | ORAL | 0 refills | Status: DC

## 2023-06-14 NOTE — Telephone Encounter (Signed)
 Patient called back and will just like to go back on metformin  as she doesn't want to go through the hassle of calling insurance. Please send in medication to [facility]

## 2023-06-14 NOTE — Telephone Encounter (Signed)
 Pt was notified.

## 2023-06-14 NOTE — Addendum Note (Signed)
 Addended by: Charliene Conte A on: 06/14/2023 09:46 AM   Modules accepted: Orders

## 2023-07-05 ENCOUNTER — Ambulatory Visit
Admission: RE | Admit: 2023-07-05 | Discharge: 2023-07-05 | Disposition: A | Discharge status: Home / Self Care | Payer: Medicare Other | Source: Ambulatory Visit | Attending: Medical | Admitting: Medical

## 2023-07-05 DIAGNOSIS — N958 Other specified menopausal and perimenopausal disorders: Secondary | ICD-10-CM | POA: Diagnosis not present

## 2023-07-05 DIAGNOSIS — E2839 Other primary ovarian failure: Secondary | ICD-10-CM | POA: Diagnosis not present

## 2023-07-05 DIAGNOSIS — Z780 Asymptomatic menopausal state: Secondary | ICD-10-CM

## 2023-07-09 ENCOUNTER — Ambulatory Visit: Payer: Self-pay | Admitting: Medical

## 2023-07-09 NOTE — Progress Notes (Signed)
 Results sent through MyChart

## 2023-12-04 ENCOUNTER — Ambulatory Visit (INDEPENDENT_AMBULATORY_CARE_PROVIDER_SITE_OTHER)

## 2023-12-04 VITALS — BP 138/80 | HR 73 | Temp 97.9°F | Ht 66.5 in | Wt 176.2 lb

## 2023-12-04 DIAGNOSIS — Z0000 Encounter for general adult medical examination without abnormal findings: Secondary | ICD-10-CM

## 2023-12-04 NOTE — Patient Instructions (Addendum)
 Brenda [name],  Thank you for taking the time for your Medicare Wellness Visit. I appreciate your continued commitment to your health goals. Please review the care plan we discussed, and feel free to reach out if I can assist you further.  Medicare recommends these wellness visits once per year to help you and your care team stay ahead of potential health issues. These visits are designed to focus on prevention, allowing your provider to concentrate on managing your acute and chronic conditions during your regular appointments.  Please note that Annual Wellness Visits do not include a physical exam. Some assessments may be limited, especially if the visit was conducted virtually. If needed, we may recommend a separate in-person follow-up with your provider.  Ongoing Care Seeing your primary care provider every 3 to 6 months helps us  monitor your health and provide consistent, personalized care.   Referrals If a referral was made during today's visit and you haven't received any updates within two weeks, please contact the referred provider directly to check on the status.  Recommended Screenings:  Health Maintenance  Topic Date Due   Zoster (Shingles) Vaccine (1 of 2) 12/14/1999   Flu Shot  09/14/2023   COVID-19 Vaccine (4 - 2025-26 season) 10/15/2023   Yearly kidney health urinalysis for diabetes  11/28/2023   Complete foot exam   11/28/2023   Hemoglobin A1C  12/06/2023   Eye exam for diabetics  02/05/2024   Yearly kidney function blood test for diabetes  06/05/2024   DTaP/Tdap/Td vaccine (3 - Td or Tdap) 10/31/2024   Medicare Annual Wellness Visit  12/03/2024   Breast Cancer Screening  02/19/2025   Pneumococcal Vaccine for age over 47  Completed   DEXA scan (bone density measurement)  Completed   Hepatitis C Screening  Completed   Meningitis B Vaccine  Aged Out   Colon Cancer Screening  Discontinued       12/04/2023    4:11 PM  Advanced Directives  Does Patient Have a Medical  Advance Directive? Yes  Type of Estate agent of [place];Living will  Copy of Healthcare Power of Attorney in Chart? No - copy requested   Advance Care Planning is important because it: Ensures you receive medical care that aligns with your values, goals, and preferences. Provides guidance to your family and loved ones, reducing the emotional burden of decision-making during critical moments.  Vision: Annual vision screenings are recommended for early detection of glaucoma, cataracts, and diabetic retinopathy. These exams can also reveal signs of chronic conditions such as diabetes and high blood pressure.  Dental: Annual dental screenings help detect early signs of oral cancer, gum disease, and other conditions linked to overall health, including heart disease and diabetes.  Please see the attached documents for additional preventive care recommendations.

## 2023-12-04 NOTE — Progress Notes (Signed)
 Subjective:   Brenda [name] is a 73 y.o. who presents for a Medicare Wellness preventive visit.  As a reminder, Annual Wellness Visits don't include a physical exam, and some assessments may be limited, especially if this visit is performed virtually. We may recommend an in-person follow-up visit with your provider if needed.  Visit Complete: In person    Persons Participating in Visit: Patient.  AWV Questionnaire: No: Patient Medicare AWV questionnaire was not completed prior to this visit.  Cardiac Risk Factors include: advanced age (>33men, >61 women);diabetes mellitus;dyslipidemia;hypertension     Objective:    Today's Vitals   12/04/23 1603  BP: 138/80  Pulse: 73  Temp: 97.9 F (36.6 C)  TempSrc: Oral  SpO2: 95%  Weight: 176 lb 3.2 oz (79.9 kg)  Height: 5' 6.5 (1.689 m)   Body mass index is 28.01 kg/m.     12/04/2023    4:11 PM  Advanced Directives  Does Patient Have a Medical Advance Directive? Yes  Type of Estate agent of [place];Living will  Copy of Healthcare Power of Attorney in Chart? No - copy requested    Current Medications (verified) Outpatient Encounter Medications as of 12/04/2023  Medication Sig   atenolol  (TENORMIN ) 50 MG tablet Take 1 tablet (50 mg total) by mouth daily.   buPROPion  (WELLBUTRIN  XL) 150 MG 24 hr tablet Take 1 tablet (150 mg total) by mouth daily.   co-enzyme Q-10 50 MG capsule Take 1 capsule (50 mg total) by mouth daily.   fish oil-omega-3 fatty acids 1000 MG capsule Take 1 g by mouth daily.     hydrochlorothiazide  (HYDRODIURIL ) 25 MG tablet Take 1 tablet (25 mg total) by mouth daily.   metFORMIN  (GLUCOPHAGE -XR) 500 MG 24 hr tablet Take 1 tablet (500 mg total) by mouth 2 (two) times daily with a meal.   rosuvastatin  (CRESTOR ) 20 MG tablet Take 1 tablet (20 mg total) by mouth daily.   sertraline  (ZOLOFT ) 100 MG tablet TAKE HALF TABLET BY MOUTH EVERY DAY   valsartan  (DIOVAN ) 320 MG tablet Take 1  tablet (320 mg total) by mouth daily.   verapamil  (CALAN -SR) 180 MG CR tablet Take 1 tablet (180 mg total) by mouth daily.   vitamin E 200 UNIT capsule Take 200 Units by mouth daily.   No facility-administered encounter medications on file as of 12/04/2023.    Allergies (verified) Janumet  xr [sitaglip phos-metformin  hcl er], Metformin  and related, and Nuprin [ibuprofen]   History: Past Medical History:  Diagnosis Date   Depression    Diabetes mellitus    Former smoker    Hyperlipidemia    Hypertension    Osteopenia    2016 bone density, normal appearing bone density 2019   Routine gynecological examination    Dr. Layman   Vitamin D  deficiency    Wears dentures    Wears glasses    Past Surgical History:  Procedure Laterality Date   CESAREAN SECTION  04/12/1983   COLONOSCOPY  2006   declines further colonoscopy   Family History  Problem Relation Age of Onset   Hypertension Mother    Heart disease Mother        died at 39 with angioplasty complication   Heart disease Father        valvuloplasty   Hypertension Sister    Other Sister        complications of HIV   Hypertension Sister    Hypertension Sister    Breast cancer Neg Hx  Social History   Socioeconomic History   Marital status: Widowed    Spouse name: Not on file   Number of children: Not on file   Years of education: Not on file   Highest education level: Not on file  Occupational History   Not on file  Tobacco Use   Smoking status: Former   Smokeless tobacco: Never  Vaping Use   Vaping status: Never Used  Substance and Sexual Activity   Alcohol use: No   Drug use: No   Sexual activity: Not Currently    Birth control/protection: Post-menopausal  Other Topics Concern   Not on file  Social History Narrative   Still works one day per week.   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  Her daughter lives with her.  Former smoker.  11/2021     Social Drivers  of Health   Financial Resource Strain: Low Risk  (12/04/2023)   Overall Financial Resource Strain (CARDIA)    Difficulty of Paying Living Expenses: Not hard at all  Food Insecurity: No Food Insecurity (12/04/2023)   Hunger Vital Sign    Worried About Running Out of Food in the Last Year: Never true    Ran Out of Food in the Last Year: Never true  Transportation Needs: No Transportation Needs (12/04/2023)   PRAPARE - Administrator, Civil Service (Medical): No    Lack of Transportation (Non-Medical): No  Physical Activity: Sufficiently Active (12/04/2023)   Exercise Vital Sign    Days of Exercise per Week: 7 days    Minutes of Exercise per Session: 40 min  Stress: No Stress Concern Present (12/04/2023)   [employer] of Occupational Health - Occupational Stress Questionnaire    Feeling of Stress: Not at all  Social Connections: Moderately Isolated (12/04/2023)   Social Connection and Isolation Panel    Frequency of Communication with Friends and Family: More than three times a week    Frequency of Social Gatherings with Friends and Family: Not on file    Attends Religious Services: More than 4 times per year    Active Member of [employer] or Organizations: No    Attends Banker Meetings: Never    Marital Status: Widowed    Tobacco Counseling Counseling given: Not Answered    Clinical Intake:  Pre-visit preparation completed: Yes  Pain : No/denies pain     Nutritional Status: BMI 25 -29 Overweight Nutritional Risks: None Diabetes: Yes CBG done?: No Did pt. bring in CBG monitor from home?: No  Lab Results  Component Value Date   HGBA1C 6.7 (H) 06/06/2023   HGBA1C 6.7 (H) 11/28/2022   HGBA1C 6.6 (H) 11/17/2021     How often do you need to have someone help you when you read instructions, pamphlets, or other written materials from your doctor or pharmacy?: 1 - Never  Interpreter Needed?: No  Information entered by :: NAllen  LPN   Activities of Daily Living     12/04/2023    4:06 PM  In your present state of health, do you have any difficulty performing the following activities:  Hearing? 0  Vision? 0  Difficulty concentrating or making decisions? 1  Comment memory sometimes  Walking or climbing stairs? 0  Dressing or bathing? 0  Doing errands, shopping? 0  Preparing Food and eating ? N  Using the Toilet? N  In the past six months, have you accidently leaked urine? N  Do you have problems with loss of bowel control? N  Managing your Medications? N  Managing your Finances? N  Housekeeping or managing your Housekeeping? N    Patient Care Team: Tysinger, Alm GORMAN RIGGERS as PCP - General (Family Medicine) [facility]  I have updated your Care Teams any recent Medical Services you may have received from other providers in the past year.     Assessment:   This is a routine wellness examination for Sayre.  Hearing/Vision screen Hearing Screening - Comments:: Denies hearing issues Vision Screening - Comments:: Regular eye exams, [facility]   Goals Addressed             This Visit's Progress    Patient Stated       12/04/2023, wants to lose 5-6 pounds       Depression Screen     12/04/2023    4:13 PM 06/06/2023    1:47 PM 11/28/2022   10:02 AM 11/17/2021    8:23 AM 06/10/2021   11:48 AM 01/13/2021    1:58 PM 08/31/2020    2:15 PM  PHQ 2/9 Scores  PHQ - 2 Score 0 0 0 0 1 0 0  PHQ- 9 Score     2 0     Fall Risk     12/04/2023    4:12 PM 06/06/2023    1:47 PM 11/28/2022   10:02 AM 05/19/2022   12:18 PM 11/17/2021    8:22 AM  Fall Risk   Falls in the past year? 0 0 0 0 0  Number falls in past yr: 0 0 0 0 0  Injury with Fall? 0 0 0 0 0  Risk for fall due to : Medication side effect No Fall Risks No Fall Risks No Fall Risks No Fall Risks  Follow up Falls evaluation completed;Falls prevention discussed Falls evaluation completed Falls evaluation completed  Falls evaluation completed Falls evaluation completed      Data saved with a previous flowsheet row definition    MEDICARE RISK AT HOME:  Medicare Risk at Home Any stairs in or around the home?: Yes If so, are there any without handrails?: No Home free of loose throw rugs in walkways, pet beds, electrical cords, etc?: Yes Adequate lighting in your home to reduce risk of falls?: Yes Life alert?: No Use of a cane, walker or w/c?: No Grab bars in the bathroom?: No Shower chair or bench in shower?: No Elevated toilet seat or a handicapped toilet?: No  TIMED UP AND GO:  Was the test performed?  Yes  Length of time to ambulate 10 feet: 5 sec Gait steady and fast without use of assistive device  Cognitive Function: 6CIT completed        12/04/2023    4:13 PM  6CIT Screen  What Year? 0 points  What month? 0 points  What time? 0 points  Count back from 20 0 points  Months in reverse 0 points  Repeat phrase 2 points  Total Score 2 points    Immunizations Immunization History  Administered Date(s) Administered   Fluad Quad(high Dose 65+) 10/07/2018, 12/03/2019, 11/17/2021   Fluad Trivalent(High Dose 65+) 11/28/2022   INFLUENZA, HIGH DOSE SEASONAL PF 10/30/2016, 12/05/2017   Influenza Split 11/05/2013, 11/01/2014   Influenza-Unspecified 12/20/2015, 10/19/2020   Moderna Sars-Covid-2 Vaccination 02/15/2019, 03/15/2019, 12/03/2019   PPD Test 09/30/2008   Pneumococcal Conjugate-13 11/30/2008   Pneumococcal Polysaccharide-23 12/23/2010, 04/13/2020   Tdap 10/03/2011, 11/01/2014   Zoster,  Live 07/27/2012    Screening Tests Health Maintenance  Topic Date Due   Zoster Vaccines- Shingrix (1 of 2) 12/14/1999   Influenza Vaccine  09/14/2023   COVID-19 Vaccine (4 - 2025-26 season) 10/15/2023   Diabetic kidney evaluation - Urine ACR  11/28/2023   FOOT EXAM  11/28/2023   HEMOGLOBIN A1C  12/06/2023   OPHTHALMOLOGY EXAM  02/05/2024   Diabetic kidney evaluation - eGFR measurement   06/05/2024   DTaP/Tdap/Td (3 - Td or Tdap) 10/31/2024   Medicare Annual Wellness (AWV)  12/03/2024   Mammogram  02/19/2025   Pneumococcal Vaccine: 50+ Years  Completed   DEXA SCAN  Completed   Hepatitis C Screening  Completed   Meningococcal B Vaccine  Aged Out   Colonoscopy  Discontinued    Health Maintenance Items Addressed: Vaccines Due: flu, covid and shingles  Additional Screening:  Vision Screening: Recommended annual ophthalmology exams for early detection of glaucoma and other disorders of the eye. Is the patient up to date with their annual eye exam?  Yes  Who is the provider or what is the name of the office in which the patient attends annual eye exams? [facility]  Dental Screening: Recommended annual dental exams for proper oral hygiene  Community Resource Referral / Chronic Care Management: CRR required this visit?  No   CCM required this visit?  No   Plan:    I have personally reviewed and noted the following in the patient's chart:   Medical and social history Use of alcohol, tobacco or illicit drugs  Current medications and supplements including opioid prescriptions. Patient is not currently taking opioid prescriptions. Functional ability and status Nutritional status Physical activity Advanced directives List of other physicians Hospitalizations, surgeries, and ER visits in previous 12 months Vitals Screenings to include cognitive, depression, and falls Referrals and appointments  In addition, I have reviewed and discussed with patient certain preventive protocols, quality metrics, and best practice recommendations. A written personalized care plan for preventive services as well as general preventive health recommendations were provided to patient.   Ardella FORBES Dawn, LPN   [id number]   After Visit Summary: (In Person-Printed) AVS printed and given to the patient  Notes: Nothing significant to report at this time.

## 2023-12-06 ENCOUNTER — Ambulatory Visit: Admitting: Medical

## 2023-12-06 VITALS — BP 110/70 | HR 60 | Wt 177.0 lb

## 2023-12-06 DIAGNOSIS — I10 Essential (primary) hypertension: Secondary | ICD-10-CM

## 2023-12-06 DIAGNOSIS — N1831 Chronic kidney disease, stage 3a: Secondary | ICD-10-CM

## 2023-12-06 DIAGNOSIS — E785 Hyperlipidemia, unspecified: Secondary | ICD-10-CM

## 2023-12-06 DIAGNOSIS — Z7185 Encounter for immunization safety counseling: Secondary | ICD-10-CM

## 2023-12-06 DIAGNOSIS — F325 Major depressive disorder, single episode, in full remission: Secondary | ICD-10-CM

## 2023-12-06 DIAGNOSIS — Z23 Encounter for immunization: Secondary | ICD-10-CM | POA: Diagnosis not present

## 2023-12-06 DIAGNOSIS — E1169 Type 2 diabetes mellitus with other specified complication: Secondary | ICD-10-CM

## 2023-12-06 DIAGNOSIS — E118 Type 2 diabetes mellitus with unspecified complications: Secondary | ICD-10-CM

## 2023-12-06 DIAGNOSIS — Z780 Asymptomatic menopausal state: Secondary | ICD-10-CM | POA: Diagnosis not present

## 2023-12-06 NOTE — Progress Notes (Signed)
 Name: Brenda [name]   Date of Visit: 12/06/23   Date of last visit with me: 06/13/2023   CHIEF COMPLAINT:  Chief Complaint  Patient presents with   Medical Management of Chronic Issues    6 month follow-up diabetes.        HPI:  Discussed the use of AI scribe software for clinical note transcription with the patient, who gave verbal consent to proceed.  History of Present Illness   Brenda [name] is a 73 year old female with diabetes and hypertension who presents for a regular medication check.  She has no new symptoms or concerns and feels healthy. Her diabetes is managed with metformin  500 mg twice daily, despite a known nausea adverse effect, due to cost issues with other medications. She experiences mild nausea but manages it by eating when taking the medication. Previously, she was on Kombiglyze , but insurance did not cover it, leading to a switch to metformin . The previous medication was too expensive, costing $550 per month.  Her current medications include Atenolol  50 mg, hydrochlorothiazide  25 mg, valsartan  320 mg, verapamil  SR 180 mg, Crestor  20 mg, sertraline  100 mg daily, and Wellbutrin  XL 150 mg. She reports stable weight and blood pressure, and her last A1c was stable. She is working on improving her water intake and engages in regular physical activity, primarily walking.  She has not received the new shingles vaccine or the latest pneumonia vaccine, Prevnar 20. She had chickenpox as a child. She had a bone density test in May. She experiences knee and hip discomfort with excessive activity but does not engage in weight-bearing exercises beyond regular activities.  Past Medical History:  Diagnosis Date   Depression    Diabetes mellitus    Former smoker    Hyperlipidemia    Hypertension    Osteopenia    2016 bone density, normal appearing bone density 2019   Routine gynecological examination    Dr. Layman   Vitamin D  deficiency    Wears  dentures    Wears glasses    Current Outpatient Medications on File Prior to Visit  Medication Sig Dispense Refill   atenolol  (TENORMIN ) 50 MG tablet Take 1 tablet (50 mg total) by mouth daily. 90 tablet 3   buPROPion  (WELLBUTRIN  XL) 150 MG 24 hr tablet Take 1 tablet (150 mg total) by mouth daily. 90 tablet 3   co-enzyme Q-10 50 MG capsule Take 1 capsule (50 mg total) by mouth daily. 90 capsule 3   fish oil-omega-3 fatty acids 1000 MG capsule Take 1 g by mouth daily.       hydrochlorothiazide  (HYDRODIURIL ) 25 MG tablet Take 1 tablet (25 mg total) by mouth daily. 90 tablet 3   metFORMIN  (GLUCOPHAGE -XR) 500 MG 24 hr tablet Take 1 tablet (500 mg total) by mouth 2 (two) times daily with a meal. 180 tablet 0   rosuvastatin  (CRESTOR ) 20 MG tablet Take 1 tablet (20 mg total) by mouth daily. 90 tablet 1   sertraline  (ZOLOFT ) 100 MG tablet TAKE HALF TABLET BY MOUTH EVERY DAY 90 tablet 3   valsartan  (DIOVAN ) 320 MG tablet Take 1 tablet (320 mg total) by mouth daily. 90 tablet 1   verapamil  (CALAN -SR) 180 MG CR tablet Take 1 tablet (180 mg total) by mouth daily. 90 tablet 3   vitamin E 200 UNIT capsule Take 200 Units by mouth daily.     No current facility-administered medications on file prior to visit.   ROS as in  subjective   Objective: BP 110/70   Pulse 60   Wt 177 lb (80.3 kg)   LMP  (LMP Unknown)   BMI 28.14 kg/m   General appearence: alert, no distress, WD/WN,  Neck: supple, no lymphadenopathy, no thyromegaly, no masses Heart: RRR, normal S1, S2, no murmurs Lungs: CTA bilaterally, no wheezes, rhonchi, or rales Pulses: 2+ symmetric, upper and lower extremities, normal cap refill  Diabetic Foot Exam - Simple   Simple Foot Form Diabetic Foot exam was performed with the following findings: Yes 12/06/2023  2:25 PM  Visual Inspection See comments: Yes Sensation Testing Intact to touch and monofilament testing bilaterally: Yes Pulse Check See comments: Yes Comments 1+ pedal pulses,  mild callus bilat 5th toes laterally of distal phalanx       Assessment and Plan Encounter Diagnoses  Name Primary?   Diabetes mellitus with complication (HCC) Yes   CKD stage 3a, GFR 45-59 ml/min (HCC)    Needs flu shot    COVID-19 vaccine administered    Essential hypertension    Hyperlipidemia associated with type 2 diabetes mellitus (HCC)    Vaccine counseling    Postmenopausal estrogen deficiency    Depression, major, in remission     Type 2 Diabetes Mellitus Type 2 diabetes managed with metformin . Experiences nausea but tolerates medication. A1c stable, but alternative medications or pharmacist consultation may be considered if not at target. - Order A1c test. - Consider pharmacist consultation if A1c is not at target. - Continue metformin  XR 500 mg twice daily.  Chronic Kidney Disease Stage 3A Chronic kidney disease stage 3A with GFR of 50. Maintaining blood pressure and blood sugar control is crucial to prevent progression. - Monitor kidney function regularly. - Maintain blood pressure and blood sugar control.  Hypertension Hypertension well-controlled with current regimen. - Continue current antihypertensive regimen.  Hyperlipidemia Hyperlipidemia managed with Crestor . - Continue Crestor  20 mg.  Depression Depression managed with sertraline  and Wellbutrin  XL. - Continue sertraline  100 mg daily. - Continue Wellbutrin  XL 150 mg.  General Health Maintenance Discussed vaccinations including flu, shingles, and pneumonia. She agrees to receive the flu shot today. - Administer flu vaccine and covid vaccine today. - Discuss shingles and pneumonia vaccines. - Encourage weight-bearing exercise at least two days a week.  I reviewed 06/2023 bone density results, normal   Brenda [name] was seen today for medical management of chronic issues.  Diagnoses and all orders for this visit:  Diabetes mellitus with complication (HCC) -     Hemoglobin A1c -      Microalbumin/Creatinine Ratio, Urine  CKD stage 3a, GFR 45-59 ml/min (HCC) -     Comprehensive metabolic panel with GFR -     CBC  Needs flu shot -     Flu vaccine HIGH DOSE PF(Fluzone Trivalent)  COVID-19 vaccine administered -     Pfizer Comirnaty Covid-19 Vaccine 64yrs & older -     Microalbumin/Creatinine Ratio, Urine  Essential hypertension -     Urinalysis, Routine w reflex microscopic  Hyperlipidemia associated with type 2 diabetes mellitus (HCC) -     Lipid panel  Vaccine counseling  Postmenopausal estrogen deficiency  Depression, major, in remission    F/u pending labs

## 2023-12-07 ENCOUNTER — Ambulatory Visit: Payer: Self-pay | Admitting: Medical

## 2023-12-07 ENCOUNTER — Other Ambulatory Visit: Payer: Self-pay | Admitting: Medical

## 2023-12-07 DIAGNOSIS — I10 Essential (primary) hypertension: Secondary | ICD-10-CM

## 2023-12-07 LAB — URINALYSIS, ROUTINE W REFLEX MICROSCOPIC
Bilirubin, UA: NEGATIVE
Glucose, UA: NEGATIVE
Ketones, UA: NEGATIVE
Nitrite, UA: NEGATIVE
Protein,UA: NEGATIVE
RBC, UA: NEGATIVE
Specific Gravity, UA: 1.012 (ref 1.005–1.030)
Urobilinogen, Ur: 0.2 mg/dL (ref 0.2–1.0)
pH, UA: 7 (ref 5.0–7.5)

## 2023-12-07 LAB — COMPREHENSIVE METABOLIC PANEL WITH GFR
ALT: 12 IU/L (ref 0–32)
AST: 20 IU/L (ref 0–40)
Albumin: 4.4 g/dL (ref 3.8–4.8)
Alkaline Phosphatase: 76 IU/L (ref 49–135)
BUN/Creatinine Ratio: 15 (ref 12–28)
BUN: 21 mg/dL (ref 8–27)
Bilirubin Total: 0.2 mg/dL (ref 0.0–1.2)
CO2: 23 mmol/L (ref 20–29)
Calcium: 9.4 mg/dL (ref 8.7–10.3)
Chloride: 101 mmol/L (ref 96–106)
Creatinine, Ser: 1.36 mg/dL — ABNORMAL HIGH (ref 0.57–1.00)
Globulin, Total: 2.5 g/dL (ref 1.5–4.5)
Glucose: 121 mg/dL — ABNORMAL HIGH (ref 70–99)
Potassium: 4.5 mmol/L (ref 3.5–5.2)
Sodium: 137 mmol/L (ref 134–144)
Total Protein: 6.9 g/dL (ref 6.0–8.5)
eGFR: 41 mL/min/1.73 — ABNORMAL LOW (ref >59)

## 2023-12-07 LAB — MICROSCOPIC EXAMINATION
Bacteria, UA: NONE SEEN
Casts: NONE SEEN /LPF
RBC, Urine: NONE SEEN /HPF (ref 0–2)
WBC, UA: NONE SEEN /HPF (ref 0–5)

## 2023-12-07 LAB — HEMOGLOBIN A1C
Est. average glucose Bld gHb Est-mCnc: 163 mg/dL
Hgb A1c MFr Bld: 7.3 % — ABNORMAL HIGH (ref 4.8–5.6)

## 2023-12-07 LAB — CBC
Hematocrit: 34.6 % (ref 34.0–46.6)
Hemoglobin: 11.4 g/dL (ref 11.1–15.9)
MCH: 30 pg (ref 26.6–33.0)
MCHC: 32.9 g/dL (ref 31.5–35.7)
MCV: 91 fL (ref 79–97)
Platelets: 285 x10E3/uL (ref 150–450)
RBC: 3.8 x10E6/uL (ref 3.77–5.28)
RDW: 13.6 % (ref 11.7–15.4)
WBC: 6.1 x10E3/uL (ref 3.4–10.8)

## 2023-12-07 LAB — MICROALBUMIN / CREATININE URINE RATIO
Creatinine, Urine: 70.4 mg/dL
Microalb/Creat Ratio: <4 mg/g{creat} (ref 0–29)
Microalbumin, Urine: <3 ug/mL

## 2023-12-07 LAB — LIPID PANEL
Chol/HDL Ratio: 3.6 ratio (ref 0.0–4.4)
Cholesterol, Total: 159 mg/dL (ref 100–199)
HDL: 44 mg/dL (ref >39)
LDL Chol Calc (NIH): 88 mg/dL (ref 0–99)
Triglycerides: 158 mg/dL — ABNORMAL HIGH (ref 0–149)
VLDL Cholesterol Cal: 27 mg/dL (ref 5–40)

## 2023-12-07 MED ORDER — HYDROCHLOROTHIAZIDE 25 MG PO TABS: 25.0000 mg | ORAL_TABLET | Freq: Every day | ORAL | 3 refills | Status: AC

## 2023-12-07 MED ORDER — ATENOLOL 50 MG PO TABS: 50.0000 mg | ORAL_TABLET | Freq: Every day | ORAL | 3 refills | Status: AC

## 2023-12-07 MED ORDER — EMPAGLIFLOZIN 10 MG PO TABS: 10.0000 mg | ORAL_TABLET | Freq: Every day | ORAL | 0 refills | Status: AC

## 2023-12-07 MED ORDER — SERTRALINE HCL 100 MG PO TABS: ORAL_TABLET | ORAL | 1 refills | Status: AC

## 2023-12-07 MED ORDER — ROSUVASTATIN CALCIUM 20 MG PO TABS: 20.0000 mg | ORAL_TABLET | Freq: Every day | ORAL | 3 refills | Status: AC

## 2023-12-07 MED ORDER — METFORMIN HCL ER 500 MG PO TB24: 500.0000 mg | ORAL_TABLET | Freq: Every day | ORAL | 0 refills | Status: AC

## 2023-12-07 MED ORDER — VALSARTAN 320 MG PO TABS: 320.0000 mg | ORAL_TABLET | Freq: Every day | ORAL | 3 refills | Status: AC

## 2023-12-07 MED ORDER — BUPROPION HCL ER (XL) 150 MG PO TB24: 150.0000 mg | ORAL_TABLET | Freq: Every day | ORAL | 1 refills | Status: AC

## 2023-12-07 MED ORDER — VERAPAMIL HCL ER 180 MG PO TBCR: 180.0000 mg | EXTENDED_RELEASE_TABLET | Freq: Every day | ORAL | 3 refills | Status: AC

## 2023-12-07 NOTE — Progress Notes (Signed)
 Results thru my chart

## 2023-12-10 ENCOUNTER — Other Ambulatory Visit: Payer: Self-pay | Admitting: Medical

## 2024-02-01 ENCOUNTER — Other Ambulatory Visit: Payer: Self-pay | Admitting: Medical

## 2024-02-01 DIAGNOSIS — Z1231 Encounter for screening mammogram for malignant neoplasm of breast: Secondary | ICD-10-CM

## 2024-02-26 ENCOUNTER — Ambulatory Visit
Admission: RE | Admit: 2024-02-26 | Discharge: 2024-02-26 | Disposition: A | Payer: PRIVATE HEALTH INSURANCE | Source: Ambulatory Visit | Attending: Medical | Admitting: Medical

## 2024-02-26 DIAGNOSIS — Z1231 Encounter for screening mammogram for malignant neoplasm of breast: Secondary | ICD-10-CM

## 2024-02-28 ENCOUNTER — Ambulatory Visit: Payer: Self-pay | Admitting: Medical

## 2024-02-29 ENCOUNTER — Other Ambulatory Visit: Payer: Self-pay | Admitting: Medical

## 2024-06-10 ENCOUNTER — Ambulatory Visit: Payer: Self-pay | Admitting: Medical

## 2024-12-09 ENCOUNTER — Ambulatory Visit: Payer: Self-pay
